# Patient Record
Sex: Male | Born: 1935 | Race: White | Hispanic: No | Marital: Married | Smoking: Former smoker
Health system: Southern US, Community
[De-identification: ages and names within clinical notes are randomized; demographics above are authoritative.]

## PROBLEM LIST (undated history)

## (undated) DIAGNOSIS — R5381 Other malaise: Secondary | ICD-10-CM

## (undated) DIAGNOSIS — K219 Gastro-esophageal reflux disease without esophagitis: Secondary | ICD-10-CM

## (undated) DIAGNOSIS — R062 Wheezing: Secondary | ICD-10-CM

## (undated) DIAGNOSIS — Z9889 Other specified postprocedural states: Secondary | ICD-10-CM

## (undated) DIAGNOSIS — Z87898 Personal history of other specified conditions: Secondary | ICD-10-CM

## (undated) DIAGNOSIS — J449 Chronic obstructive pulmonary disease, unspecified: Secondary | ICD-10-CM

## (undated) DIAGNOSIS — R911 Solitary pulmonary nodule: Secondary | ICD-10-CM

## (undated) DIAGNOSIS — E785 Hyperlipidemia, unspecified: Secondary | ICD-10-CM

## (undated) DIAGNOSIS — R5383 Other fatigue: Secondary | ICD-10-CM

## (undated) DIAGNOSIS — I10 Essential (primary) hypertension: Secondary | ICD-10-CM

## (undated) DIAGNOSIS — E739 Lactose intolerance, unspecified: Secondary | ICD-10-CM

## (undated) DIAGNOSIS — Z8669 Personal history of other diseases of the nervous system and sense organs: Secondary | ICD-10-CM

## (undated) DIAGNOSIS — Z9089 Acquired absence of other organs: Secondary | ICD-10-CM

## (undated) DIAGNOSIS — M545 Low back pain: Secondary | ICD-10-CM

## (undated) DIAGNOSIS — M479 Spondylosis, unspecified: Secondary | ICD-10-CM

## (undated) DIAGNOSIS — J8410 Pulmonary fibrosis, unspecified: Secondary | ICD-10-CM

## (undated) DIAGNOSIS — M129 Arthropathy, unspecified: Secondary | ICD-10-CM

## (undated) DIAGNOSIS — K5730 Diverticulosis of large intestine without perforation or abscess without bleeding: Secondary | ICD-10-CM

## (undated) DIAGNOSIS — Z8719 Personal history of other diseases of the digestive system: Secondary | ICD-10-CM

## (undated) DIAGNOSIS — I2510 Atherosclerotic heart disease of native coronary artery without angina pectoris: Secondary | ICD-10-CM

## (undated) DIAGNOSIS — R42 Dizziness and giddiness: Secondary | ICD-10-CM

## (undated) DIAGNOSIS — J309 Allergic rhinitis, unspecified: Secondary | ICD-10-CM

## (undated) DIAGNOSIS — I739 Peripheral vascular disease, unspecified: Secondary | ICD-10-CM

## (undated) HISTORY — DX: Other malaise: R53.81

## (undated) HISTORY — DX: Essential (primary) hypertension: I10

## (undated) HISTORY — DX: Dizziness and giddiness: R42

## (undated) HISTORY — DX: Atherosclerotic heart disease of native coronary artery without angina pectoris: I25.10

## (undated) HISTORY — DX: Diverticulosis of large intestine without perforation or abscess without bleeding: K57.30

## (undated) HISTORY — PX: CARPAL TUNNEL RELEASE: SHX101

## (undated) HISTORY — DX: Gastro-esophageal reflux disease without esophagitis: K21.9

## (undated) HISTORY — DX: Hyperlipidemia, unspecified: E78.5

## (undated) HISTORY — DX: Lactose intolerance, unspecified: E73.9

## (undated) HISTORY — DX: Chronic obstructive pulmonary disease, unspecified: J44.9

## (undated) HISTORY — DX: Allergic rhinitis, unspecified: J30.9

## (undated) HISTORY — PX: TONSILLECTOMY: SUR1361

## (undated) HISTORY — DX: Personal history of other specified conditions: Z87.898

## (undated) HISTORY — DX: Acquired absence of other organs: Z90.89

## (undated) HISTORY — DX: Other specified postprocedural states: Z98.89

## (undated) HISTORY — DX: Spondylosis, unspecified: M47.9

## (undated) HISTORY — DX: Peripheral vascular disease, unspecified: I73.9

## (undated) HISTORY — PX: HEMORRHOID SURGERY: SHX153

## (undated) HISTORY — PX: CATARACT EXTRACTION: SUR2

## (undated) HISTORY — PX: APPENDECTOMY: SHX54

## (undated) HISTORY — DX: Solitary pulmonary nodule: R91.1

## (undated) HISTORY — DX: Wheezing: R06.2

## (undated) HISTORY — DX: Arthropathy, unspecified: M12.9

## (undated) HISTORY — DX: Personal history of other diseases of the nervous system and sense organs: Z86.69

## (undated) HISTORY — DX: Other fatigue: R53.83

## (undated) HISTORY — DX: Pulmonary fibrosis, unspecified: J84.10

## (undated) HISTORY — DX: Low back pain: M54.5

## (undated) HISTORY — PX: OTHER SURGICAL HISTORY: SHX169

## (undated) HISTORY — DX: Personal history of other diseases of the digestive system: Z87.19

---

## 1999-07-05 ENCOUNTER — Ambulatory Visit (HOSPITAL_COMMUNITY): Admission: RE | Admit: 1999-07-05 | Discharge: 1999-07-05 | Payer: Self-pay | Admitting: *Deleted

## 1999-07-05 ENCOUNTER — Encounter: Payer: Self-pay | Admitting: *Deleted

## 2000-08-21 ENCOUNTER — Ambulatory Visit (HOSPITAL_COMMUNITY): Admission: RE | Admit: 2000-08-21 | Discharge: 2000-08-21 | Payer: Self-pay | Admitting: Internal Medicine

## 2000-08-21 ENCOUNTER — Encounter: Payer: Self-pay | Admitting: Internal Medicine

## 2000-12-02 ENCOUNTER — Encounter: Payer: Self-pay | Admitting: Internal Medicine

## 2000-12-02 ENCOUNTER — Ambulatory Visit (HOSPITAL_COMMUNITY): Admission: RE | Admit: 2000-12-02 | Discharge: 2000-12-02 | Payer: Self-pay | Admitting: Internal Medicine

## 2004-09-01 ENCOUNTER — Encounter: Admission: RE | Admit: 2004-09-01 | Discharge: 2004-09-01 | Payer: Self-pay | Admitting: Orthopaedic Surgery

## 2004-11-24 ENCOUNTER — Ambulatory Visit: Payer: Self-pay | Admitting: Internal Medicine

## 2004-12-01 ENCOUNTER — Ambulatory Visit: Payer: Self-pay

## 2004-12-22 ENCOUNTER — Ambulatory Visit: Payer: Self-pay | Admitting: Internal Medicine

## 2005-11-19 ENCOUNTER — Ambulatory Visit: Payer: Self-pay | Admitting: Internal Medicine

## 2006-10-07 ENCOUNTER — Ambulatory Visit: Payer: Self-pay | Admitting: Gastroenterology

## 2006-10-21 ENCOUNTER — Ambulatory Visit: Payer: Self-pay | Admitting: Gastroenterology

## 2006-12-03 ENCOUNTER — Ambulatory Visit: Payer: Self-pay

## 2006-12-04 ENCOUNTER — Ambulatory Visit: Payer: Self-pay | Admitting: Internal Medicine

## 2006-12-04 LAB — CONVERTED CEMR LAB
ALT: 30 units/L (ref 0–40)
AST: 29 units/L (ref 0–37)
Albumin: 4 g/dL (ref 3.5–5.2)
Alkaline Phosphatase: 69 units/L (ref 39–117)
BUN: 14 mg/dL (ref 6–23)
Basophils Absolute: 0 10*3/uL (ref 0.0–0.1)
Basophils Relative: 0.1 % (ref 0.0–1.0)
Bilirubin, Direct: 0.2 mg/dL (ref 0.0–0.3)
CO2: 29 meq/L (ref 19–32)
Calcium: 9.4 mg/dL (ref 8.4–10.5)
Chloride: 105 meq/L (ref 96–112)
Cholesterol: 133 mg/dL (ref 0–200)
Creatinine, Ser: 1.1 mg/dL (ref 0.4–1.5)
Eosinophils Absolute: 0.3 10*3/uL (ref 0.0–0.6)
Eosinophils Relative: 4.1 % (ref 0.0–5.0)
GFR calc Af Amer: 86 mL/min
GFR calc non Af Amer: 71 mL/min
Glucose, Bld: 117 mg/dL — ABNORMAL HIGH (ref 70–99)
HCT: 48.4 % (ref 39.0–52.0)
HDL: 46.5 mg/dL (ref >39.0)
Hemoglobin: 16.9 g/dL (ref 13.0–17.0)
LDL Cholesterol: 71 mg/dL (ref 0–99)
Lymphocytes Relative: 20.7 % (ref 12.0–46.0)
MCHC: 34.8 g/dL (ref 30.0–36.0)
MCV: 90.2 fL (ref 78.0–100.0)
Monocytes Absolute: 0.5 10*3/uL (ref 0.2–0.7)
Monocytes Relative: 7.9 % (ref 3.0–11.0)
Neutro Abs: 4.6 10*3/uL (ref 1.4–7.7)
Neutrophils Relative %: 67.2 % (ref 43.0–77.0)
Platelets: 207 10*3/uL (ref 150–400)
Potassium: 4.8 meq/L (ref 3.5–5.1)
RBC: 5.37 M/uL (ref 4.22–5.81)
RDW: 13 % (ref 11.5–14.6)
Sodium: 140 meq/L (ref 135–145)
TSH: 2.41 microintl units/mL (ref 0.35–5.50)
Total Bilirubin: 1 mg/dL (ref 0.3–1.2)
Total CHOL/HDL Ratio: 2.9
Total Protein: 7.9 g/dL (ref 6.0–8.3)
Triglycerides: 77 mg/dL (ref 0–149)
VLDL: 15 mg/dL (ref 0–40)
WBC: 6.8 10*3/uL (ref 4.5–10.5)

## 2007-07-25 ENCOUNTER — Encounter: Payer: Self-pay | Admitting: *Deleted

## 2007-07-25 DIAGNOSIS — Z87898 Personal history of other specified conditions: Secondary | ICD-10-CM

## 2007-07-25 DIAGNOSIS — Z9089 Acquired absence of other organs: Secondary | ICD-10-CM

## 2007-07-25 DIAGNOSIS — E785 Hyperlipidemia, unspecified: Secondary | ICD-10-CM | POA: Insufficient documentation

## 2007-07-25 DIAGNOSIS — Z8669 Personal history of other diseases of the nervous system and sense organs: Secondary | ICD-10-CM | POA: Insufficient documentation

## 2007-07-25 DIAGNOSIS — K219 Gastro-esophageal reflux disease without esophagitis: Secondary | ICD-10-CM

## 2007-07-25 DIAGNOSIS — I10 Essential (primary) hypertension: Secondary | ICD-10-CM

## 2007-07-25 DIAGNOSIS — M129 Arthropathy, unspecified: Secondary | ICD-10-CM

## 2007-07-25 DIAGNOSIS — M479 Spondylosis, unspecified: Secondary | ICD-10-CM | POA: Insufficient documentation

## 2007-07-25 DIAGNOSIS — Z8719 Personal history of other diseases of the digestive system: Secondary | ICD-10-CM

## 2007-07-25 DIAGNOSIS — Z9889 Other specified postprocedural states: Secondary | ICD-10-CM

## 2007-07-25 DIAGNOSIS — I2510 Atherosclerotic heart disease of native coronary artery without angina pectoris: Secondary | ICD-10-CM

## 2007-07-25 HISTORY — DX: Gastro-esophageal reflux disease without esophagitis: K21.9

## 2007-07-25 HISTORY — DX: Arthropathy, unspecified: M12.9

## 2007-07-25 HISTORY — DX: Personal history of other diseases of the digestive system: Z87.19

## 2007-07-25 HISTORY — DX: Essential (primary) hypertension: I10

## 2007-07-25 HISTORY — DX: Spondylosis, unspecified: M47.9

## 2007-07-25 HISTORY — DX: Personal history of other specified conditions: Z87.898

## 2007-07-25 HISTORY — DX: Hyperlipidemia, unspecified: E78.5

## 2007-07-25 HISTORY — DX: Atherosclerotic heart disease of native coronary artery without angina pectoris: I25.10

## 2007-07-25 HISTORY — DX: Other specified postprocedural states: Z98.89

## 2007-07-25 HISTORY — DX: Acquired absence of other organs: Z90.89

## 2007-07-25 HISTORY — DX: Personal history of other diseases of the nervous system and sense organs: Z86.69

## 2007-10-30 ENCOUNTER — Encounter: Payer: Self-pay | Admitting: Internal Medicine

## 2007-12-03 ENCOUNTER — Encounter: Payer: Self-pay | Admitting: Internal Medicine

## 2007-12-03 ENCOUNTER — Ambulatory Visit: Payer: Self-pay

## 2007-12-08 ENCOUNTER — Ambulatory Visit: Payer: Self-pay | Admitting: Internal Medicine

## 2007-12-08 DIAGNOSIS — E739 Lactose intolerance, unspecified: Secondary | ICD-10-CM

## 2007-12-08 DIAGNOSIS — J309 Allergic rhinitis, unspecified: Secondary | ICD-10-CM | POA: Insufficient documentation

## 2007-12-08 DIAGNOSIS — R5381 Other malaise: Secondary | ICD-10-CM | POA: Insufficient documentation

## 2007-12-08 DIAGNOSIS — K5730 Diverticulosis of large intestine without perforation or abscess without bleeding: Secondary | ICD-10-CM | POA: Insufficient documentation

## 2007-12-08 DIAGNOSIS — I739 Peripheral vascular disease, unspecified: Secondary | ICD-10-CM

## 2007-12-08 DIAGNOSIS — R5383 Other fatigue: Secondary | ICD-10-CM

## 2007-12-08 HISTORY — DX: Diverticulosis of large intestine without perforation or abscess without bleeding: K57.30

## 2007-12-08 HISTORY — DX: Lactose intolerance, unspecified: E73.9

## 2007-12-08 HISTORY — DX: Other malaise: R53.81

## 2007-12-08 HISTORY — DX: Peripheral vascular disease, unspecified: I73.9

## 2007-12-08 HISTORY — DX: Allergic rhinitis, unspecified: J30.9

## 2008-12-03 ENCOUNTER — Encounter: Payer: Self-pay | Admitting: Internal Medicine

## 2008-12-03 ENCOUNTER — Ambulatory Visit: Payer: Self-pay

## 2008-12-07 ENCOUNTER — Telehealth: Payer: Self-pay | Admitting: Internal Medicine

## 2008-12-08 ENCOUNTER — Ambulatory Visit: Payer: Self-pay | Admitting: Internal Medicine

## 2009-09-08 ENCOUNTER — Ambulatory Visit: Payer: Self-pay | Admitting: Internal Medicine

## 2009-09-08 DIAGNOSIS — R42 Dizziness and giddiness: Secondary | ICD-10-CM

## 2009-09-08 HISTORY — DX: Dizziness and giddiness: R42

## 2009-09-09 ENCOUNTER — Encounter: Payer: Self-pay | Admitting: Internal Medicine

## 2009-09-09 ENCOUNTER — Ambulatory Visit: Payer: Self-pay | Admitting: Internal Medicine

## 2009-09-09 DIAGNOSIS — J4489 Other specified chronic obstructive pulmonary disease: Secondary | ICD-10-CM

## 2009-09-09 DIAGNOSIS — J449 Chronic obstructive pulmonary disease, unspecified: Secondary | ICD-10-CM

## 2009-09-09 HISTORY — DX: Other specified chronic obstructive pulmonary disease: J44.89

## 2009-09-09 HISTORY — DX: Chronic obstructive pulmonary disease, unspecified: J44.9

## 2009-09-16 ENCOUNTER — Telehealth: Payer: Self-pay | Admitting: Internal Medicine

## 2009-09-16 ENCOUNTER — Telehealth (INDEPENDENT_AMBULATORY_CARE_PROVIDER_SITE_OTHER): Payer: Self-pay | Admitting: *Deleted

## 2009-09-26 ENCOUNTER — Ambulatory Visit: Payer: Self-pay | Admitting: Cardiovascular Disease

## 2009-09-29 ENCOUNTER — Telehealth: Payer: Self-pay | Admitting: Cardiovascular Disease

## 2009-10-07 ENCOUNTER — Ambulatory Visit: Payer: Self-pay | Admitting: Cardiovascular Disease

## 2009-12-09 ENCOUNTER — Ambulatory Visit: Payer: Self-pay | Admitting: Internal Medicine

## 2009-12-09 LAB — CONVERTED CEMR LAB
Alkaline Phosphatase: 62 units/L (ref 39–117)
Basophils Absolute: 0.2 10*3/uL — ABNORMAL HIGH (ref 0.0–0.1)
Bilirubin Urine: NEGATIVE
Bilirubin, Direct: 0.2 mg/dL (ref 0.0–0.3)
Chloride: 102 meq/L (ref 96–112)
Cholesterol: 121 mg/dL (ref 0–200)
Eosinophils Absolute: 0.4 10*3/uL (ref 0.0–0.7)
GFR calc non Af Amer: 78.21 mL/min (ref >60)
HDL: 49.6 mg/dL (ref >39.00)
LDL Cholesterol: 57 mg/dL (ref 0–99)
Leukocytes, UA: NEGATIVE
Lymphocytes Relative: 20.1 % (ref 12.0–46.0)
MCHC: 33.5 g/dL (ref 30.0–36.0)
MCV: 93.8 fL (ref 78.0–100.0)
Monocytes Absolute: 0.5 10*3/uL (ref 0.1–1.0)
Neutrophils Relative %: 61.4 % (ref 43.0–77.0)
Nitrite: NEGATIVE
PSA: 0.43 ng/mL (ref 0.10–4.00)
Platelets: 204 10*3/uL (ref 150.0–400.0)
Potassium: 4.7 meq/L (ref 3.5–5.1)
RDW: 12.5 % (ref 11.5–14.6)
Sodium: 138 meq/L (ref 135–145)
Specific Gravity, Urine: 1.01 (ref 1.000–1.030)
Total Bilirubin: 0.7 mg/dL (ref 0.3–1.2)
Total Protein: 8 g/dL (ref 6.0–8.3)
VLDL: 14.6 mg/dL (ref 0.0–40.0)
pH: 7.5 (ref 5.0–8.0)

## 2009-12-12 ENCOUNTER — Ambulatory Visit: Payer: Self-pay | Admitting: Internal Medicine

## 2010-03-21 ENCOUNTER — Telehealth: Payer: Self-pay | Admitting: Internal Medicine

## 2010-05-23 ENCOUNTER — Encounter: Payer: Self-pay | Admitting: Internal Medicine

## 2010-09-29 ENCOUNTER — Ambulatory Visit
Admission: RE | Admit: 2010-09-29 | Discharge: 2010-09-29 | Payer: Self-pay | Source: Home / Self Care | Attending: Internal Medicine | Admitting: Internal Medicine

## 2010-09-29 ENCOUNTER — Encounter: Payer: Self-pay | Admitting: Internal Medicine

## 2010-09-29 DIAGNOSIS — R911 Solitary pulmonary nodule: Secondary | ICD-10-CM

## 2010-09-29 DIAGNOSIS — R062 Wheezing: Secondary | ICD-10-CM

## 2010-09-29 HISTORY — DX: Solitary pulmonary nodule: R91.1

## 2010-09-29 HISTORY — DX: Wheezing: R06.2

## 2010-10-03 ENCOUNTER — Ambulatory Visit: Payer: Self-pay | Admitting: Cardiology

## 2010-10-03 ENCOUNTER — Ambulatory Visit
Admission: RE | Admit: 2010-10-03 | Discharge: 2010-10-03 | Payer: Self-pay | Source: Home / Self Care | Attending: Internal Medicine | Admitting: Internal Medicine

## 2010-10-03 DIAGNOSIS — R072 Precordial pain: Secondary | ICD-10-CM

## 2010-10-06 ENCOUNTER — Encounter: Payer: Self-pay | Admitting: Internal Medicine

## 2010-10-09 ENCOUNTER — Telehealth: Payer: Self-pay | Admitting: Internal Medicine

## 2010-10-29 LAB — CONVERTED CEMR LAB
AST: 25 units/L (ref 0–37)
Albumin: 3.8 g/dL (ref 3.5–5.2)
Albumin: 3.8 g/dL (ref 3.5–5.2)
Alkaline Phosphatase: 66 units/L (ref 39–117)
BUN: 11 mg/dL (ref 6–23)
BUN: 15 mg/dL (ref 6–23)
Basophils Absolute: 0 10*3/uL (ref 0.0–0.1)
Basophils Absolute: 0 10*3/uL (ref 0.0–0.1)
Basophils Absolute: 0 10*3/uL (ref 0.0–0.1)
Basophils Relative: 0.5 % (ref 0.0–3.0)
Bilirubin Urine: NEGATIVE
CO2: 30 meq/L (ref 19–32)
Calcium: 9.2 mg/dL (ref 8.4–10.5)
Calcium: 9.6 mg/dL (ref 8.4–10.5)
Chloride: 104 meq/L (ref 96–112)
Chloride: 105 meq/L (ref 96–112)
Cholesterol: 132 mg/dL (ref 0–200)
Creatinine, Ser: 1.1 mg/dL (ref 0.4–1.5)
Creatinine, Ser: 1.1 mg/dL (ref 0.4–1.5)
Creatinine, Ser: 1.2 mg/dL (ref 0.4–1.5)
Eosinophils Absolute: 0.2 10*3/uL (ref 0.0–0.7)
Eosinophils Absolute: 0.3 10*3/uL (ref 0.0–0.7)
Folate: 12.3 ng/mL
GFR calc Af Amer: 85 mL/min
GFR calc non Af Amer: 70 mL/min
GFR calc non Af Amer: 70.12 mL/min (ref >60)
HCT: 49.8 % (ref 39.0–52.0)
HDL: 46 mg/dL (ref >39.0)
HDL: 46.2 mg/dL (ref >39.0)
Hemoglobin, Urine: NEGATIVE
Hemoglobin: 16 g/dL (ref 13.0–17.0)
Hgb A1c MFr Bld: 5.9 % (ref 4.6–6.0)
Ketones, ur: NEGATIVE mg/dL
LDL Cholesterol: 65 mg/dL (ref 0–99)
Lymphocytes Relative: 18.7 % (ref 12.0–46.0)
MCHC: 33 g/dL (ref 30.0–36.0)
MCHC: 33.6 g/dL (ref 30.0–36.0)
MCHC: 33.7 g/dL (ref 30.0–36.0)
Monocytes Absolute: 0.7 10*3/uL (ref 0.1–1.0)
Monocytes Relative: 10 % (ref 3.0–11.0)
Monocytes Relative: 11.1 % (ref 3.0–12.0)
Monocytes Relative: 8.8 % (ref 3.0–12.0)
Neutro Abs: 3.8 10*3/uL (ref 1.4–7.7)
Neutro Abs: 4.3 10*3/uL (ref 1.4–7.7)
Neutrophils Relative %: 68.6 % (ref 43.0–77.0)
Nitrite: NEGATIVE
Platelets: 198 10*3/uL (ref 150–400)
Platelets: 221 10*3/uL (ref 150–400)
RBC: 4.96 M/uL (ref 4.22–5.81)
RBC: 5.36 M/uL (ref 4.22–5.81)
RDW: 12.4 % (ref 11.5–14.6)
RDW: 12.7 % (ref 11.5–14.6)
RDW: 12.7 % (ref 11.5–14.6)
Sed Rate: 8 mm/hr (ref 0–16)
Sodium: 140 meq/L (ref 135–145)
TSH: 2.42 microintl units/mL (ref 0.35–5.50)
TSH: 2.9 microintl units/mL (ref 0.35–5.50)
Total Bilirubin: 0.7 mg/dL (ref 0.3–1.2)
Total Bilirubin: 0.8 mg/dL (ref 0.3–1.2)
Total CHOL/HDL Ratio: 2.9
Total CHOL/HDL Ratio: 3.1
Total Protein, Urine: NEGATIVE mg/dL
Triglycerides: 58 mg/dL (ref 0–149)
VLDL: 12 mg/dL (ref 0–40)
Vitamin B-12: 238 pg/mL (ref 211–911)

## 2010-10-31 NOTE — Assessment & Plan Note (Signed)
Summary: YEARLY PHYSICAL-LB   Vital Signs:  Patient profile:   75 year old male Height:      71 inches Weight:      183.56 pounds BMI:     25.69 O2 Sat:      94 % on Room air Temp:     98.2 degrees F oral Pulse rate:   56 / minute BP sitting:   152 / 80  (left arm) Cuff size:   regular  Vitals Entered ByZella Ball Ewing (December 12, 2009 8:31 AM)  O2 Flow:  Room air  Preventive Care Screening  Last Flu Shot:    Date:  08/31/2009    Results:  given   CC: Adult Physical/RE   CC:  Adult Physical/RE.  History of Present Illness: dizziness resolved;  BP at home < 140/90 but occasionally up to 160 with stress it seems;  Pt denies CP, sob, doe, wheezing, orthopnea, pnd, worsening LE edema, palps, dizziness or syncope  Pt denies new neuro symptoms such as headache, facial or extremity weakness Denies signfiicant polydipsia or polyruia  Here for wellness Diet: Heart Healthy or DM if diabetic Physical Activities: Sedentary Depression/mood screen: Negative Hearing: Intact bilateral Visual Acuity: Grossly normal ADL's: Capable  Fall Risk: None Home Safety: Good End-of-Life Planning: Advance directive - Full code/I agree   Preventive Screening-Counseling & Management      Drug Use:  no.    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 1 By Mouth Once Daily 2)  Dovonex 0.005 %  Crea (Calcipotriene) .... Use Daily 3)  Glucosamine  Tabs .... Take One Tablet Once Daily 4)  Altace 10 Mg  Tabs (Ramipril) .... Take One Tablet Twice Daily (Daw) 5)  Viagra 50 Mg  Tabs (Sildenafil Citrate) .... Take As Needed Once Daily By Mouth 6)  Nasacort Aq 55 Mcg/act  Aers (Triamcinolone Acetonide(Nasal)) .... 2 Spary/side Once Daily 7)  Nexium 40 Mg  Cpdr (Esomeprazole Magnesium) .Marland Kitchen.. 1 By Mouth Once Daily 8)  Ecotrin Low Strength 81 Mg  Tbec (Aspirin) .Marland Kitchen.. 1po Qd 9)  Meclizine Hcl 12.5 Mg Tabs (Meclizine Hcl) .Marland Kitchen.. 1 -2 By Mouth Q 6 Hrs As Needed Dizzy  Current Medications (verified): 1)  Simvastatin 40 Mg  Tabs (Simvastatin) .Marland Kitchen.. 1 By Mouth Once Daily 2)  Dovonex 0.005 %  Crea (Calcipotriene) .... Use Daily 3)  Glucosamine  Tabs .... Take One Tablet Once Daily 4)  Altace 10 Mg  Tabs (Ramipril) .... Take One Tablet Twice Daily (Daw) 5)  Viagra 50 Mg  Tabs (Sildenafil Citrate) .... Take As Needed  Once Daily By Mouth 6)  Nasacort Aq 55 Mcg/act  Aers (Triamcinolone Acetonide(Nasal)) .... 2 Spray/side Once Daily 7)  Nexium 40 Mg  Cpdr (Esomeprazole Magnesium) .Marland Kitchen.. 1 By Mouth Once Daily 8)  Ecotrin Low Strength 81 Mg  Tbec (Aspirin) .Marland Kitchen.. 1po Qd 9)  Meclizine Hcl 12.5 Mg Tabs (Meclizine Hcl) .Marland Kitchen.. 1 -2 By Mouth Q 6 Hrs As Needed Dizzy  Allergies (verified): 1)  ! Imdur 2)  ! Codeine  Past History:  Past Medical History: Last updated: 12/08/2007 DIVERTICULOSIS, COLON, HX OF (ICD-V12.79) DEGENERATIVE JOINT DISEASE, CERVICAL SPINE (ICD-721.90) CORONARY ARTERY DISEASE (ICD-414.00) - non-obstructive 3/05 GERD (ICD-530.81) CARPAL TUNNEL RELEASE, HX OF  (ICD-V45.89) BENIGN PROSTATIC HYPERTROPHY, HX OF (ICD-V13.8) ARTHRITIS (ICD-716.90) HYPERLIPIDEMIA (ICD-272.4) HYPERTENSION (ICD-401.9) Diverticulosis, colon Allergic rhinitis Peripheral vascular disease - carotid glucose intolerance T-spine and c-spine DJD E.D. 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         all otherwise negative per pt -    Physical Exam  General:  alert and overweight-appearing.   Head:  normocephalic and atraumatic.   Eyes:  vision grossly intact, pupils equal, and pupils round.   Ears:  R ear normal and L ear normal.   Nose:  no external deformity and no nasal discharge.   Mouth:  no gingival abnormalities and pharynx  pink and moist.   Neck:  supple and no masses.   Lungs:  normal respiratory effort, R decreased breath sounds, and L decreased breath sounds.   Heart:  normal rate and regular rhythm.   Abdomen:  soft, non-tender, and normal bowel sounds.   Msk:  no joint tenderness and no joint swelling.   Extremities:  no edema, no erythema  Neurologic:  cranial nerves II-XII intact, strength normal in all extremities, and DTRs symmetrical and normal.     Impression & Recommendations:  Problem # 1:  Preventive Health Care (ICD-V70.0)  Overall doing well, age appropriate education and counseling updated and referral for appropriate preventive services done unless declined, immunizations up to date or declined, diet counseling done if overweight, urged to quit smoking if smokes , most recent labs reviewed and current ordered if appropriate, ecg reviewed or declined (interpretation per ECG scanned in the EMR if done); information regarding Medicare Prevention requirements given if appropriate   Orders: First annual wellness visit with prevention plan  (V4098)  Problem # 2:  COPD (ICD-496) stable overall by hx and exam, ok to continue meds/tx as is, -  no meds needed at this time  Problem # 3:  DIZZINESS (ICD-780.4)  His updated medication list for this problem includes:    Meclizine Hcl 12.5 Mg Tabs (Meclizine hcl) .Marland Kitchen... 1 -2 by mouth q 6 hrs as needed dizzy resolved, ok to follow  Problem # 4:  GLUCOSE INTOLERANCE (ICD-271.3) asympt - ok to follow  Problem # 5:  HYPERTENSION (ICD-401.9)  His updated medication list for this problem includes:    Altace 10 Mg Tabs (Ramipril) .Marland Kitchen... Take one tablet twice daily (daw)  BP today: 152/80 Prior BP: 150/80 (09/08/2009)  Labs Reviewed: K+: 4.7 (12/09/2009) Creat: : 1.0 (12/09/2009)   Chol: 121 (12/09/2009)   HDL: 49.60 (12/09/2009)   LDL: 57 (12/09/2009)   TG: 73.0 (12/09/2009) stable overall by hx and exam, ok to continue meds/tx as is , ecg  reviewed  Orders: Prescription Created Electronically [phone]) EKG w/ Interpretation (93000)  Problem # 6:  HYPERLIPIDEMIA (ICD-272.4)  His updated medication list for this problem includes:    Simvastatin 40 Mg Tabs (Simvastatin) .Marland Kitchen... 1 by mouth once daily  Labs Reviewed: SGOT: 29 (12/09/2009)   SGPT: 31 (12/09/2009)   HDL:49.60 (12/09/2009), 46.2 (12/08/2008)  LDL:57 (12/09/2009), 83 (12/08/2008)  Chol:121 (12/09/2009), 141 (12/08/2008)  Trig:73.0 (12/09/2009), 58 (12/08/2008) stable overall by hx and exam, ok to continue meds/tx as is   Complete Medication List: 1)  Simvastatin 40 Mg Tabs (Simvastatin) .Marland Kitchen.. 1 by mouth once daily 2)  Dovonex 0.005 % Crea (Calcipotriene) .... Use daily 3)  Glucosamine Tabs  .... Take one tablet once daily 4)  Altace 10 Mg Tabs (Ramipril) .... Take one tablet twice daily (daw) 5)  Viagra 50 Mg Tabs (Sildenafil citrate) .... Take as needed once daily by mouth 6)  Nasacort Aq 55 Mcg/act Aers (Triamcinolone acetonide(nasal)) .... 2 spray/side once daily 7)  Nexium 40 Mg Cpdr (Esomeprazole magnesium) .Marland Kitchen.. 1 by mouth once daily 8)  Ecotrin Low Strength 81 Mg Tbec (Aspirin) .Marland Kitchen.. 1po qd 9)  Meclizine Hcl 12.5 Mg Tabs (Meclizine hcl) .Marland Kitchen.. 1 -2 by mouth q 6 hrs as needed dizzy  Patient Instructions: 1)  Your blood work was excellent today 2)  Continue all previous medications as before this visit  3)  Your are given the refills today 4)  Please schedule a follow-up appointment in 6 months. 5)  Check your Blood Pressure regularly. If it is above 140/90 on a regular basis: you should make an appointment sooner Prescriptions: VIAGRA 50 MG  TABS (SILDENAFIL CITRATE) take as needed once daily by mouth  #5 x 11   Entered and Authorized by:   Corwin Levins MD   Signed by:   Corwin Levins MD on 12/12/2009   Method used:   Print then Give to Patient   RxID:   [id number] NEXIUM 40 MG  CPDR (ESOMEPRAZOLE MAGNESIUM) 1 by mouth once daily  #90 x 3   Entered and  Authorized by:   Corwin Levins MD   Signed by:   Corwin Levins MD on 12/12/2009   Method used:   Print then Give to Patient   RxID:   [id number] SIMVASTATIN 40 MG  TABS (SIMVASTATIN) 1 by mouth once daily  #90 x 3   Entered and Authorized by:   Corwin Levins MD   Signed by:   Corwin Levins MD on 12/12/2009   Method used:   Print then Give to Patient   RxID:   [id number] ALTACE 10 MG  TABS (RAMIPRIL) Take one tablet twice daily (DAW)  #  180 x 3   Entered and Authorized by:   Corwin Levins MD   Signed by:   Corwin Levins MD on 12/12/2009   Method used:   Print then Give to Patient   RxID:   [id number] NASACORT AQ 55 MCG/ACT  AERS (TRIAMCINOLONE ACETONIDE(NASAL)) 2 spray/side once daily  #3 x 3   Entered and Authorized by:   Corwin Levins MD   Signed by:   Corwin Levins MD on 12/12/2009   Method used:   Print then Give to Patient   RxID:   [id number]

## 2010-10-31 NOTE — Progress Notes (Signed)
Summary: Viagra PA  Phone Note From Pharmacy   [facility] Summary of Call: PA request--Viagra. Levitra is the preferred med, please advise. Initial call taken by: Lucious Groves,  March 21, 2010 2:18 PM  Follow-up for Phone Call        call pt - ok for levitra instead?   Follow-up by: Corwin Levins MD,  March 21, 2010 2:19 PM  Additional Follow-up for Phone Call Additional follow up Details #1::        pt is okay to try Levitra. [facility] in [place] Additional Follow-up by: Margaret Pyle, CMA,  March 21, 2010 3:20 PM    Additional Follow-up for Phone Call Additional follow up Details #2::    done escript Follow-up by: Corwin Levins MD,  March 21, 2010 3:43 PM  New/Updated Medications: LEVITRA 20 MG TABS (VARDENAFIL HCL) 1po once daily as needed Prescriptions: LEVITRA 20 MG TABS (VARDENAFIL HCL) 1po once daily as needed  #5 x 11   Entered and Authorized by:   Corwin Levins MD   Signed by:   Corwin Levins MD on 03/21/2010   Method used:   Electronically to        [facility] [address] (retail)       [address]       [place], [place]  [address]       Ph: [phone] or [phone]       Fax: [phone]   RxID:   [id number]

## 2010-10-31 NOTE — Procedures (Signed)
Summary: summary report  summary report   Imported By: Mirna Mires 11/17/2009 14:17:51  _____________________________________________________________________  External Attachment:    Type:   Image     Comment:   External Document

## 2010-10-31 NOTE — Procedures (Signed)
Summary: SUMMARY REPORT  SUMMARY REPORT   Imported By: Mirna Mires 10/14/2009 11:23:10  _____________________________________________________________________  External Attachment:    Type:   Image     Comment:   External Document

## 2010-10-31 NOTE — Medication Information (Signed)
Summary: Ramipril/[facility]  Ramipril/[facility]   Imported By: Sherian Rein 05/29/2010 08:19:56  _____________________________________________________________________  External Attachment:    Type:   Image     Comment:   External Document

## 2010-10-31 NOTE — Miscellaneous (Signed)
Summary: [facility]  [facility]   Imported By: Lester Texico 12/14/2009 09:12:16  _____________________________________________________________________  External Attachment:    Type:   Image     Comment:   External Document

## 2010-11-02 NOTE — Assessment & Plan Note (Signed)
Summary: COUGH  STC   Vital Signs:  Patient profile:   75 year old male Height:      71 inches Weight:      183 pounds BMI:     25.62 O2 Sat:      95 % on Room air Temp:     98.1 degrees F oral Pulse rate:   79 / minute BP sitting:   110 / 78  (left arm) Cuff size:   regular  Vitals Entered By: Zella Ball Ewing CMA Duncan Dull) (September 29, 2010 11:45 AM)  O2 Flow:  Room air CC: Cough and SOB/RE   CC:  Cough and SOB/RE.  History of Present Illness: here with acute onset 3 days mild to mod, gradually worsening fever, ST, mild prod cough - greenish sputum, and today mild wheezing , sob and doe  - overall  mild worse on chronic sob/doe pt states for the past 6 months;  Pt denies CP,  orthopnea, pnd, worsening LE edema, palps, dizziness or syncope .  Pt denies new neuro symptoms such as headache, facial or extremity weakness  Pt denies polydipsia, polyuria   Overall good compliance with meds, trying to follow low chol, diet, wt stable, little excercise however , and no hx of DM.  Overall good compliance with meds, and good tolerability.  Denies worsening depressive symptoms, suicidal ideation, or panic.  , but seems anxious with illness today.  No hx of afib or arrythemia, but does have hx of CAD/PVD, and COPD.  Does have baseline incresed dyspnea in the past 6 mo as well - requests inhaler.   Problems Prior to Update: 1)  Solitary Pulmonary Nodule  (ICD-793.11) 2)  Wheezing  (ICD-786.07) 3)  Bronchitis-acute  (ICD-466.0) 4)  Preventive Health Care  (ICD-V70.0) 5)  COPD  (ICD-496) 6)  Dizziness  (ICD-780.4) 7)  Glucose Intolerance  (ICD-271.3) 8)  Peripheral Vascular Disease  (ICD-443.9) 9)  Allergic Rhinitis  (ICD-477.9) 10)  Diverticulosis, Colon  (ICD-562.10) 11)  Glucose Intolerance  (ICD-271.3) 12)  Fatigue  (ICD-780.79) 13)  Rotator Cuff Repair, Right, Hx of  (ICD-V45.89) 14)  Diverticulosis, Colon, Hx of  (ICD-V12.79) 15)  Degenerative Joint Disease, Cervical Spine   (ICD-721.90) 16)  Cataracts, Bilateral, Hx of  (ICD-V12.40) 17)  Coronary Artery Disease  (ICD-414.00) 18)  Gerd  (ICD-530.81) 19)  Carpal Tunnel Release, Hx of  (ICD-V45.89) 20)  Hemorrhoidectomy, Hx of  (ICD-V45.89) 21)  Appendectomy, Hx of  (ICD-V45.79) 22)  Tonsillectomy, Hx of  (ICD-V45.79) 23)  Benign Prostatic Hypertrophy, Hx of  (ICD-V13.8) 24)  Arthritis  (ICD-716.90) 25)  Hyperlipidemia  (ICD-272.4) 26)  Hypertension  (ICD-401.9)  Medications Prior to Update: 1)  Simvastatin 40 Mg  Tabs (Simvastatin) .Marland Kitchen.. 1 By Mouth Once Daily 2)  Dovonex 0.005 %  Crea (Calcipotriene) .... Use Daily 3)  Glucosamine  Tabs .... Take One Tablet Once Daily 4)  Altace 10 Mg  Tabs (Ramipril) .... Take One Tablet Twice Daily (Daw) 5)  Levitra 20 Mg Tabs (Vardenafil Hcl) .Marland Kitchen.. 1po Once Daily As Needed 6)  Nasacort Aq 55 Mcg/act  Aers (Triamcinolone Acetonide(Nasal)) .... 2 Spray/side Once Daily 7)  Nexium 40 Mg  Cpdr (Esomeprazole Magnesium) .Marland Kitchen.. 1 By Mouth Once Daily 8)  Ecotrin Low Strength 81 Mg  Tbec (Aspirin) .Marland Kitchen.. 1po Qd 9)  Meclizine Hcl 12.5 Mg Tabs (Meclizine Hcl) .Marland Kitchen.. 1 -2 By Mouth Q 6 Hrs As Needed Dizzy  Current Medications (verified): 1)  Simvastatin 40 Mg  Tabs (Simvastatin) .Marland Kitchen.. 1 By  Mouth Once Daily 2)  Dovonex 0.005 %  Crea (Calcipotriene) .... Use Daily 3)  Glucosamine  Tabs .... Take One Tablet Once Daily 4)  Altace 10 Mg  Tabs (Ramipril) .... Take One Tablet Twice Daily (Daw) 5)  Levitra 20 Mg Tabs (Vardenafil Hcl) .Marland Kitchen.. 1po Once Daily As Needed 6)  Nasacort Aq 55 Mcg/act  Aers (Triamcinolone Acetonide(Nasal)) .... 2 Spray/side Once Daily 7)  Nexium 40 Mg  Cpdr (Esomeprazole Magnesium) .Marland Kitchen.. 1 By Mouth Once Daily 8)  Ecotrin Low Strength 81 Mg  Tbec (Aspirin) .Marland Kitchen.. 1po Qd 9)  Meclizine Hcl 12.5 Mg Tabs (Meclizine Hcl) .Marland Kitchen.. 1 -2 By Mouth Q 6 Hrs As Needed Dizzy 10)  Levofloxacin 500 Mg Tabs (Levofloxacin) .Marland Kitchen.. 1po Once Daily 11)  Prednisone 10 Mg Tabs (Prednisone) .... 3po Qd For  3days, Then 2po Qd For 3days, Then 1po Qd For 3days, Then Stop 12)  Tessalon Perles 100 Mg Caps (Benzonatate) .Marland Kitchen.. 1-2 By Mouth Three Times A Day As Needed Cough 13)  Symbicort 160-4.5 Mcg/act Aero (Budesonide-Formoterol Fumarate) .... 2 Puffs Two Times A Day  Allergies (verified): 1)  ! Imdur 2)  ! Codeine  Past History:  Past Medical History: Last updated: 12/08/2007 DIVERTICULOSIS, COLON, HX OF (ICD-V12.79) DEGENERATIVE JOINT DISEASE, CERVICAL SPINE (ICD-721.90) CORONARY ARTERY DISEASE (ICD-414.00) - non-obstructive 3/05 GERD (ICD-530.81) CARPAL TUNNEL RELEASE, HX OF (ICD-V45.89) BENIGN PROSTATIC HYPERTROPHY, HX OF (ICD-V13.8) ARTHRITIS (ICD-716.90) HYPERLIPIDEMIA (ICD-272.4) HYPERTENSION (ICD-401.9) Diverticulosis, colon Allergic rhinitis Peripheral vascular disease - carotid glucose intolerance T-spine and c-spine DJD E.D. 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 , mild ill  Head:  normocephalic and atraumatic.   Eyes:  vision grossly intact, pupils equal, and pupils round.   Ears:  bilat tm's red, sinus nontender bilat Nose:  nasal dischargemucosal pallor and mucosal edema.   Mouth:  pharyngeal erythema and fair dentition.   Neck:  supple and no masses.   Lungs:  normal respiratory effort, R decreased breath sounds, R wheezes, L decreased breath sounds, and L wheezes.   Heart:  normal rate and regular rhythm. with  occaisional ectopy noted Extremities:  no edema, no erythema    Impression & Recommendations:  Problem # 1:  BRONCHITIS-ACUTE (ICD-466.0)  His updated medication list for this problem includes:    Levofloxacin 500 Mg Tabs (Levofloxacin) .Marland Kitchen... 1po once daily    Tessalon Perles 100 Mg Caps (Benzonatate) .Marland Kitchen... 1-2 by mouth three times a day as needed cough    Symbicort 160-4.5 Mcg/act Aero (Budesonide-formoterol fumarate) .Marland Kitchen... 2 puffs two times a day treat as above, f/u any worsening signs or symptoms , cant r/o pna - for CXR as well, and mucinex/delsym otc as needed   Orders: T-2 View CXR, Same Day (71020.5TC) Admin of Therapeutic Inj  intramuscular or subcutaneous (16109) Depo- Medrol 40mg  (J1030)  Problem # 2:  WHEEZING (ICD-786.07) mild, likely due to above, for predpack for home, and depomedrol IM today Orders: T-2 View CXR, Same Day (71020.5TC)  Problem # 3:  HYPERTENSION (  ICD-401.9)  His updated medication list for this problem includes:    Altace 10 Mg Tabs (Ramipril) .Marland Kitchen... Take one tablet twice daily (daw) with some "irreg" noted on ausculataition, ECG done but is sinus rhythm, NAD on review - no new arythmia  Problem # 4:  COPD (ICD-496)  for more chronic symtpoms will check PFT' s, as well as trial symbicort 160  Orders: Misc. Referral (Misc. Ref)  His updated medication list for this problem includes:    Symbicort 160-4.5 Mcg/act Aero (Budesonide-formoterol fumarate) .Marland Kitchen... 2 puffs two times a day  Complete Medication List: 1)  Simvastatin 40 Mg Tabs (Simvastatin) .Marland Kitchen.. 1 by mouth once daily 2)  Dovonex 0.005 % Crea (Calcipotriene) .... Use daily 3)  Glucosamine Tabs  .... Take one tablet once daily 4)  Altace 10 Mg Tabs (Ramipril) .... Take one tablet twice daily (daw) 5)  Levitra 20 Mg Tabs (Vardenafil hcl) .Marland Kitchen.. 1po once daily as needed 6)  Nasacort Aq 55 Mcg/act Aers (Triamcinolone acetonide(nasal)) .... 2 spray/side once daily 7)  Nexium 40 Mg Cpdr  (Esomeprazole magnesium) .Marland Kitchen.. 1 by mouth once daily 8)  Ecotrin Low Strength 81 Mg Tbec (Aspirin) .Marland Kitchen.. 1po qd 9)  Meclizine Hcl 12.5 Mg Tabs (Meclizine hcl) .Marland Kitchen.. 1 -2 by mouth q 6 hrs as needed dizzy 10)  Levofloxacin 500 Mg Tabs (Levofloxacin) .Marland Kitchen.. 1po once daily 11)  Prednisone 10 Mg Tabs (Prednisone) .... 3po qd for 3days, then 2po qd for 3days, then 1po qd for 3days, then stop 12)  Tessalon Perles 100 Mg Caps (Benzonatate) .Marland Kitchen.. 1-2 by mouth three times a day as needed cough 13)  Symbicort 160-4.5 Mcg/act Aero (Budesonide-formoterol fumarate) .... 2 puffs two times a day  Patient Instructions: 1)  you had the steroid shot today 2)  Please take all new medications as prescribed - all were sent to the pharmacy  ([facility] [address]), except for the sample of the symbicort 3)  Continue all previous medications as before this visit 4)  You can also use Mucinex OTC or it's generic for congestion , as well as Delsym OTC for cough (if needed in addition to the pill prescription for cough) 5)  Please go to Radiology in the basement level for your X-Ray today 6)  You will be contacted about the referral(s) to: Lung testing   7)  Please call the number on the [facility] Card for results of your testing  8)  Please schedule a follow-up appointment in March 2012 for "yearly followup" Prescriptions: SYMBICORT 160-4.5 MCG/ACT AERO (BUDESONIDE-FORMOTEROL FUMARATE) 2 puffs two times a day  #1 x 11   Entered and Authorized by:   Corwin Levins MD   Signed by:   Corwin Levins MD on 09/29/2010   Method used:   Print then Give to Patient   RxID:   [id number] TESSALON PERLES 100 MG CAPS (BENZONATATE) 1-2 by mouth three times a day as needed cough  #60 x 1   Entered and Authorized by:   Corwin Levins MD   Signed by:   Corwin Levins MD on 09/29/2010   Method used:   Electronically to        [facility] [address] (retail)       [address]       [place], [place]  [address]       Ph:  [phone] or [phone]       Fax: [phone]  RxID:   [id number] PREDNISONE 10 MG TABS (PREDNISONE) 3po qd for 3days, then 2po qd for 3days, then 1po qd for 3days, then stop  #18 x 0   Entered and Authorized by:   Corwin Levins MD   Signed by:   Corwin Levins MD on 09/29/2010   Method used:   Electronically to        [facility] [address] (retail)       [address]       [place], [place]  [address]       Ph: [phone] or [phone]       Fax: [phone]   RxID:   [id number] LEVOFLOXACIN 500 MG TABS (LEVOFLOXACIN) 1po once daily  #10 x 0   Entered and Authorized by:   Corwin Levins MD   Signed by:   Corwin Levins MD on 09/29/2010   Method used:   Electronically to        [facility] [address] (retail)       [address]       [place], [place]  [address]       Ph: [phone] or [phone]       Fax: [phone]   RxID:   [id number]    Medication Administration  Injection # 1:    Medication: Depo- Medrol 40mg     Diagnosis: BRONCHITIS-ACUTE (ICD-466.0)    Route: IM    Site: R deltoid    Exp Date: 03/31/2013    Lot #: [id number]    Mfr: Pharmacia    Comments: 120mg     Patient tolerated injection without complications    Given by: Lamar Sprinkles, CMA (September 29, 2010 5:51 PM)  Orders Added: 1)  Misc. Referral [Misc. Ref] 2)  T-2 View CXR, Same Day [71020.5TC] 3)  Admin of Therapeutic Inj  intramuscular or subcutaneous [96372] 4)  Depo- Medrol 40mg  [J1030] 5)  Est. Patient Level IV [35009]     Medication Administration  Injection # 1:    Medication: Depo- Medrol 40mg     Diagnosis: BRONCHITIS-ACUTE (ICD-466.0)    Route: IM    Site: R deltoid    Exp Date: 03/31/2013    Lot #: [id number]    Mfr: Pharmacia    Comments: 120mg     Patient tolerated injection without complications    Given by: Lamar Sprinkles, CMA (September 29, 2010 5:51 PM)  Orders Added: 1)  Misc. Referral [Misc. Ref] 2)  T-2 View CXR,  Same Day [71020.5TC] 3)  Admin of Therapeutic Inj  intramuscular or subcutaneous [96372] 4)  Depo- Medrol 40mg  [J1030] 5)  Est. Patient Level IV [38182]

## 2010-11-02 NOTE — Miscellaneous (Signed)
Summary: Orders Update pft charges  Clinical Lists Changes  Orders: Added new Service order of Carbon Monoxide diffusing w/capacity (94720) - Signed Added new Service order of Lung Volumes (94240) - Signed Added new Service order of Spirometry (Pre & Post) (94060) - Signed 

## 2010-11-02 NOTE — Progress Notes (Signed)
  Phone Note Call from Patient Call back at Home Phone [phone]   Caller: Patient Call For: Corwin Levins MD Summary of Call: Pt requests results of recent pulmonary test. Pt states results not on phone tree. Initial call taken by: Verdell Face,  October 09, 2010 11:13 AM  Follow-up for Phone Call        Pt's spouse advised and will have pt  call back if any further questions or concerns Follow-up by: Margaret Pyle, CMA,  October 09, 2010 2:40 PM

## 2010-11-02 NOTE — Miscellaneous (Signed)
Summary: Orders Update  Clinical Lists Changes  Problems: Added new problem of SOLITARY PULMONARY NODULE (ICD-793.11) Orders: Added new Referral order of Radiology Referral (Radiology) - Signed

## 2010-12-14 ENCOUNTER — Encounter (INDEPENDENT_AMBULATORY_CARE_PROVIDER_SITE_OTHER): Payer: Medicare Other | Admitting: Internal Medicine

## 2010-12-14 ENCOUNTER — Encounter: Payer: Self-pay | Admitting: Internal Medicine

## 2010-12-14 ENCOUNTER — Other Ambulatory Visit: Payer: Self-pay | Admitting: Internal Medicine

## 2010-12-14 ENCOUNTER — Other Ambulatory Visit: Payer: Medicare Other

## 2010-12-14 DIAGNOSIS — R5383 Other fatigue: Secondary | ICD-10-CM

## 2010-12-14 DIAGNOSIS — R5381 Other malaise: Secondary | ICD-10-CM

## 2010-12-14 DIAGNOSIS — I10 Essential (primary) hypertension: Secondary | ICD-10-CM

## 2010-12-14 DIAGNOSIS — E785 Hyperlipidemia, unspecified: Secondary | ICD-10-CM

## 2010-12-14 DIAGNOSIS — E739 Lactose intolerance, unspecified: Secondary | ICD-10-CM

## 2010-12-14 DIAGNOSIS — J449 Chronic obstructive pulmonary disease, unspecified: Secondary | ICD-10-CM

## 2010-12-14 LAB — CBC WITH DIFFERENTIAL/PLATELET
Basophils Absolute: 0 10*3/uL (ref 0.0–0.1)
Lymphocytes Relative: 18.3 % (ref 12.0–46.0)
Monocytes Relative: 11.7 % (ref 3.0–12.0)
Platelets: 277 10*3/uL (ref 150.0–400.0)
RDW: 15.1 % — ABNORMAL HIGH (ref 11.5–14.6)
WBC: 6.9 10*3/uL (ref 4.5–10.5)

## 2010-12-14 LAB — BASIC METABOLIC PANEL
BUN: 15 mg/dL (ref 6–23)
Calcium: 9.3 mg/dL (ref 8.4–10.5)
GFR: 83.76 mL/min (ref >60.00)
Glucose, Bld: 100 mg/dL — ABNORMAL HIGH (ref 70–99)
Sodium: 137 mEq/L (ref 135–145)

## 2010-12-14 LAB — HEPATIC FUNCTION PANEL
AST: 21 U/L (ref 0–37)
Alkaline Phosphatase: 51 U/L (ref 39–117)
Bilirubin, Direct: 0.2 mg/dL (ref 0.0–0.3)
Total Bilirubin: 0.7 mg/dL (ref 0.3–1.2)

## 2010-12-14 LAB — LIPID PANEL
Cholesterol: 132 mg/dL (ref 0–200)
HDL: 49.3 mg/dL
LDL Cholesterol: 70 mg/dL (ref 0–99)
Total CHOL/HDL Ratio: 3
Triglycerides: 64 mg/dL (ref 0.0–149.0)
VLDL: 12.8 mg/dL (ref 0.0–40.0)

## 2010-12-14 LAB — URINALYSIS
Bilirubin Urine: NEGATIVE
Hgb urine dipstick: NEGATIVE
Ketones, ur: NEGATIVE
Leukocytes, UA: NEGATIVE
Nitrite: NEGATIVE
Specific Gravity, Urine: 1.01
Total Protein, Urine: NEGATIVE
Urine Glucose: NEGATIVE
Urobilinogen, UA: 0.2
pH: 6.5 (ref 5.0–8.0)

## 2010-12-19 NOTE — Assessment & Plan Note (Signed)
Summary: YEARLY FU MEDICARE #  [id number]   Vital Signs:  Patient profile:   75 year old male Height:      71 inches Weight:      178.38 pounds BMI:     24.97 O2 Sat:      95 % on Room air Temp:     97.7 degrees F oral Pulse rate:   74 / minute BP sitting:   140 / 72  (left arm) Cuff size:   regular  Vitals Entered By: Zella Ball Ewing CMA Duncan Dull) (December 14, 2010 9:26 AM)  O2 Flow:  Room air  Preventive Care Screening  Last Flu Shot:    Date:  08/01/2010    Results:  given   CC: Yearly/RE   CC:  Yearly/RE.  History of Present Illness: here to f/u;  did see urology Dr Patsi Sears mar 12; no change in tx or need for further eval but did have PSA and UA per pt;   was found to have cyst to left renal but no change;   overall o/w doing ok - symbicort did not seem to help, however;  recent PFT's with mod COPD but little response to b-dilator;  Pt denies CP, worsening sob, doe, wheezing, orthopnea, pnd, worsening LE edema, palps, dizziness or syncope  Pt denies new neuro symptoms such as headache, facial or extremity weakness  Pt denies polydipsia, polyuria,  Overall good compliance with meds, trying to follow low chol diet, wt stable, little excercise however   Denies worsening depressive symptoms, suicidal ideation, or panic, though has some ongoing anxiety.  No fever, wt loss, night sweats, loss of appetite or other constitutional symptoms   Overall good compliance with meds, and good tolerability.  Pt states good ability with ADL's, low fall risk, home safety reviewed and adequate, no significant change in hearing or vision, trying to follow lower chol diet, and occasionally active only with regular excercise - walks 30 min 3 times per wk  Problems Prior to Update: 1)  Solitary Pulmonary Nodule  (ICD-793.11) 2)  Wheezing  (ICD-786.07) 3)  Preventive Health Care  (ICD-V70.0) 4)  COPD  (ICD-496) 5)  Dizziness  (ICD-780.4) 6)  Glucose Intolerance  (ICD-271.3) 7)  Peripheral Vascular Disease   (ICD-443.9) 8)  Allergic Rhinitis  (ICD-477.9) 9)  Diverticulosis, Colon  (ICD-562.10) 10)  Glucose Intolerance  (ICD-271.3) 11)  Fatigue  (ICD-780.79) 12)  Rotator Cuff Repair, Right, Hx of  (ICD-V45.89) 13)  Diverticulosis, Colon, Hx of  (ICD-V12.79) 14)  Degenerative Joint Disease, Cervical Spine  (ICD-721.90) 15)  Cataracts, Bilateral, Hx of  (ICD-V12.40) 16)  Coronary Artery Disease  (ICD-414.00) 17)  Gerd  (ICD-530.81) 18)  Carpal Tunnel Release, Hx of  (ICD-V45.89) 19)  Hemorrhoidectomy, Hx of  (ICD-V45.89) 20)  Appendectomy, Hx of  (ICD-V45.79) 21)  Tonsillectomy, Hx of  (ICD-V45.79) 22)  Benign Prostatic Hypertrophy, Hx of  (ICD-V13.8) 23)  Arthritis  (ICD-716.90) 24)  Hyperlipidemia  (ICD-272.4) 25)  Hypertension  (ICD-401.9)  Medications Prior to Update: 1)  Simvastatin 40 Mg  Tabs (Simvastatin) .Marland Kitchen.. 1 By Mouth Once Daily 2)  Dovonex 0.005 %  Crea (Calcipotriene) .... Use Daily 3)  Glucosamine  Tabs .... Take One Tablet Once Daily 4)  Altace 10 Mg  Tabs (Ramipril) .... Take One Tablet Twice Daily (Daw) 5)  Levitra 20 Mg Tabs (Vardenafil Hcl) .Marland Kitchen.. 1po Once Daily As Needed 6)  Nasacort Aq 55 Mcg/act  Aers (Triamcinolone Acetonide(Nasal)) .... 2 Spray/side Once Daily 7)  Nexium 40 Mg  Cpdr (Esomeprazole Magnesium) .Marland Kitchen.. 1 By Mouth Once Daily 8)  Ecotrin Low Strength 81 Mg  Tbec (Aspirin) .Marland Kitchen.. 1po Qd 9)  Meclizine Hcl 12.5 Mg Tabs (Meclizine Hcl) .Marland Kitchen.. 1 -2 By Mouth Q 6 Hrs As Needed Dizzy 10)  Levofloxacin 500 Mg Tabs (Levofloxacin) .Marland Kitchen.. 1po Once Daily 11)  Prednisone 10 Mg Tabs (Prednisone) .... 3po Qd For 3days, Then 2po Qd For 3days, Then 1po Qd For 3days, Then Stop 12)  Tessalon Perles 100 Mg Caps (Benzonatate) .Marland Kitchen.. 1-2 By Mouth Three Times A Day As Needed Cough 13)  Symbicort 160-4.5 Mcg/act Aero (Budesonide-Formoterol Fumarate) .... 2 Puffs Two Times A Day  Current Medications (verified): 1)  Simvastatin 40 Mg  Tabs (Simvastatin) .Marland Kitchen.. 1 By Mouth Once Daily 2)   Glucosamine  Tabs .... Take One Tablet Once Daily 3)  Altace 10 Mg  Tabs (Ramipril) .... Take One Tablet Twice Daily (Daw) 4)  Levitra 20 Mg Tabs (Vardenafil Hcl) .Marland Kitchen.. 1po Once Daily As Needed 5)  Nasacort Aq 55 Mcg/act  Aers (Triamcinolone Acetonide(Nasal)) .... 2 Spray/side Once Daily 6)  Nexium 40 Mg  Cpdr (Esomeprazole Magnesium) .Marland Kitchen.. 1 By Mouth Once Daily 7)  Ecotrin Low Strength 81 Mg  Tbec (Aspirin) .Marland Kitchen.. 1po Qd 8)  Spiriva Handihaler 18 Mcg Caps (Tiotropium Bromide Monohydrate) .Marland Kitchen.. 1 Puff Once Daily  Allergies (verified): 1)  ! Imdur 2)  ! 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 hx of prostate nodule COPD  Review of Systems       all otherwise negative per pt -    Physical Exam  General:  alert and well-developed. Head:  normocephalic and atraumatic.   Eyes:  vision grossly intact, pupils equal, and pupils round.   Ears:  R ear  normal and L ear normal.   Nose:  no external deformity and no nasal discharge.   Mouth:  no gingival abnormalities and pharynx pink and moist.   Neck:  supple and no masses.   Lungs:  normal respiratory effort, R decreased breath sounds, and L decreased breath sounds  but no rales or wheezing.   Heart:  normal rate and regular rhythm.   Abdomen:  soft, non-tender, and normal bowel sounds.   Msk:  no joint tenderness and no joint swelling.   Extremities:  no edema, no erythema  Neurologic:  cranial nerves II-XII intact, strength normal in all extremities, and gait normal.   Skin:  color normal and no rashes.   Psych:  not depressed appearing and slightly anxious.     Impression & Recommendations:  Problem # 1:  COPD (ICD-496)  The following medications were removed from the medication list:    Symbicort 160-4.5 Mcg/act Aero (Budesonide-formoterol fumarate) .Marland KitchenMarland KitchenMarland KitchenMarland Kitchen  2 puffs two times a day His updated medication list for this problem includes:    Spiriva Handihaler 18 Mcg Caps (Tiotropium bromide monohydrate) .Marland Kitchen... 1 puff once daily symbicort no help; recent PFT's reviewed with mod copd -  ok for spiriva trial  Pulmonary Functions Reviewed: O2 sat: 95 (12/14/2010)     Vaccines Reviewed: Pneumovax: Pneumovax (12/03/2006)   Flu Vax: given (08/01/2010)  Problem # 2:  FATIGUE (ICD-780.79)  exam benign, to check labs below; follow with expectant management    Orders: TLB-BMP (Basic Metabolic Panel-BMET) (80048-METABOL) TLB-CBC Platelet - w/Differential (85025-CBCD) TLB-Hepatic/Liver Function Pnl (80076-HEPATIC) TLB-TSH (Thyroid Stimulating Hormone) (84443-TSH)  Problem # 3:  HYPERLIPIDEMIA (ICD-272.4)  His updated medication list for this problem includes:    Simvastatin 40 Mg Tabs (Simvastatin) .Marland Kitchen... 1 by mouth once daily  Labs Reviewed: SGOT: 29 (12/09/2009)   SGPT: 31 (12/09/2009)   HDL:49.60 (12/09/2009), 46.2 (12/08/2008)  LDL:57 (12/09/2009), 83 (12/08/2008)  Chol:121  (12/09/2009), 141 (12/08/2008)  Trig:73.0 (12/09/2009), 58 (12/08/2008) stable overall by hx and exam, ok to continue meds/tx as is ,  Pt to continue diet efforts, good med tolerance; to check labs - goal LDL less than 70   Orders: TLB-Lipid Panel (80061-LIPID)  Problem # 4:  HYPERTENSION (ICD-401.9)  His updated medication list for this problem includes:    Altace 10 Mg Tabs (Ramipril) .Marland Kitchen... Take one tablet twice daily (daw)  BP today: 140/72 Prior BP: 110/78 (09/29/2010)  Labs Reviewed: K+: 4.7 (12/09/2009) Creat: : 1.0 (12/09/2009)   Chol: 121 (12/09/2009)   HDL: 49.60 (12/09/2009)   LDL: 57 (12/09/2009)   TG: 73.0 (12/09/2009) stable overall by hx and exam, ok to continue meds/tx as is   Orders: TLB-Udip ONLY (81003-UDIP)  Complete Medication List: 1)  Simvastatin 40 Mg Tabs (Simvastatin) .Marland Kitchen.. 1 by mouth once daily 2)  Glucosamine Tabs  .... Take one tablet once daily 3)  Altace 10 Mg Tabs (Ramipril) .... Take one tablet twice daily (daw) 4)  Levitra 20 Mg Tabs (Vardenafil hcl) .Marland Kitchen.. 1po once daily as needed 5)  Nasacort Aq 55 Mcg/act Aers (Triamcinolone acetonide(nasal)) .... 2 spray/side once daily 6)  Nexium 40 Mg Cpdr (Esomeprazole magnesium) .Marland Kitchen.. 1 by mouth once daily 7)  Ecotrin Low Strength 81 Mg Tbec (Aspirin) .Marland Kitchen.. 1po qd 8)  Spiriva Handihaler 18 Mcg Caps (Tiotropium bromide monohydrate) .Marland Kitchen.. 1 puff once daily  Other Orders: TLB-A1C / Hgb A1C (Glycohemoglobin) (83036-A1C)  Patient Instructions: 1)  stop the symbicort 2)  start the spiriva daily as prescribed 3)  Continue all previous medications as before this visit  4)  You are given the refills today 5)  Please go to the Lab in the basement for your blood and/or urine tests today 6)  Please call the number on the [facility] Card for results of your testing  7)  Please schedule a follow-up appointment in 1 year, or sooner if needed Prescriptions: NASACORT AQ 55 MCG/ACT  AERS (TRIAMCINOLONE ACETONIDE(NASAL)) 2  spray/side once daily  #3 x 3   Entered and Authorized by:   Corwin Levins MD   Signed by:   Corwin Levins MD on 12/14/2010   Method used:   Print then Give to Patient   RxID:   [id number] NEXIUM 40 MG  CPDR (ESOMEPRAZOLE MAGNESIUM) 1 by mouth once daily  #90 x 3   Entered and Authorized by:   Corwin Levins MD   Signed by:   Corwin Levins MD on 12/14/2010   Method  used:   Print then Give to Patient   RxID:   [id number] ALTACE 10 MG  TABS (RAMIPRIL) Take one tablet twice daily (DAW)  #180 x 3   Entered and Authorized by:   Corwin Levins MD   Signed by:   Corwin Levins MD on 12/14/2010   Method used:   Print then Give to Patient   RxID:   [id number] SIMVASTATIN 40 MG  TABS (SIMVASTATIN) 1 by mouth once daily  #90 x 3   Entered and Authorized by:   Corwin Levins MD   Signed by:   Corwin Levins MD on 12/14/2010   Method used:   Print then Give to Patient   RxID:   [id number] SPIRIVA HANDIHALER 18 MCG CAPS (TIOTROPIUM BROMIDE MONOHYDRATE) 1 puff once daily  #90 x 3   Entered and Authorized by:   Corwin Levins MD   Signed by:   Corwin Levins MD on 12/14/2010   Method used:   Print then Give to Patient   RxID:   [id number]    Orders Added: 1)  TLB-BMP (Basic Metabolic Panel-BMET) [80048-METABOL] 2)  TLB-CBC Platelet - w/Differential [85025-CBCD] 3)  TLB-Hepatic/Liver Function Pnl [80076-HEPATIC] 4)  TLB-TSH (Thyroid Stimulating Hormone) [84443-TSH] 5)  TLB-A1C / Hgb A1C (Glycohemoglobin) [83036-A1C] 6)  TLB-Lipid Panel [80061-LIPID] 7)  TLB-Udip ONLY [81003-UDIP] 8)  Est. Patient Level IV [14431]

## 2010-12-23 ENCOUNTER — Other Ambulatory Visit: Payer: Self-pay

## 2010-12-23 MED ORDER — RAMIPRIL 10 MG PO CAPS: 10.0000 mg | ORAL_CAPSULE | Freq: Two times a day (BID) | ORAL | Status: DC

## 2010-12-23 NOTE — Telephone Encounter (Signed)
Received fax from [facility] for Ramipril 10 mg 1 by mouth two times daily. Called patient to confirm correct pharmacy.

## 2011-01-02 ENCOUNTER — Other Ambulatory Visit: Payer: Self-pay | Admitting: Internal Medicine

## 2011-01-02 DIAGNOSIS — I6529 Occlusion and stenosis of unspecified carotid artery: Secondary | ICD-10-CM

## 2011-01-03 ENCOUNTER — Other Ambulatory Visit: Payer: Self-pay | Admitting: *Deleted

## 2011-01-03 ENCOUNTER — Encounter (INDEPENDENT_AMBULATORY_CARE_PROVIDER_SITE_OTHER): Payer: Medicare Other | Admitting: *Deleted

## 2011-01-03 DIAGNOSIS — I6529 Occlusion and stenosis of unspecified carotid artery: Secondary | ICD-10-CM

## 2011-01-08 ENCOUNTER — Encounter: Payer: Self-pay | Admitting: Internal Medicine

## 2011-01-08 NOTE — Progress Notes (Signed)
Quick Note:  Voice message left on PhoneTree system - lab is negative, normal or otherwise stable, pt to continue same tx ______ 

## 2011-02-13 NOTE — Assessment & Plan Note (Signed)
[facility]                        [facility] OFFICE NOTE   Calvin Pugh                        MRN:          [id number]  DATE:09/26/2009                            DOB:          August 01, 1936    CHIEF COMPLAINT:  Dizziness.   HISTORY OF PRESENT ILLNESS:  Mr. Calvin Pugh is a 75 year old white male with  past medical history significant for hypertension, hyperlipidemia,  reflux, who is presenting with 3-week history of dizziness after  standing.  The patient states that approximately 3 weeks ago, he noticed  that when he got out of bed in the morning he had acute onset of  dizziness.  There was no frank vertigo.  There was no numbness or  weakness in any of his extremities.  He was slightly nauseous.  The  patient states he was dizzy for approximately 1-1/2 days before it  completely resolved.  Since then, he has had dizziness upon standing  every morning and after most of his naps.  The dizziness usually  resolves within 15 minutes of walking around.  He denies any other  episodes of dizziness not related to raising from the supine position.  He also denies any headaches, diplopia or persistent blurry vision.  He  had slight blurry vision during that first episode 3 weeks ago.  Of  note, the patient states that he put a bobby pin in his right ear 2 days  prior to this first episode of dizziness along with some hydrogen  peroxide.  He denies any pain in his ears.  The patient states that he  had an episode that was similar to this several years ago and a  neurologic workup at that time was negative.   PAST MEDICAL HISTORY:  As above in HPI.   SOCIAL HISTORY:  No tobacco and no excessive alcohol.   FAMILY HISTORY:  Negative for premature coronary artery disease.   ALLERGIES:  No known drug allergies, although CODEINE does cause nausea.   MEDICATIONS:  1. Aspirin 81 mg daily.  2. Zocor 40 mg every night.  3. Altace 20 mg daily.  4.  Nexium 40 mg daily.  5. Glucosamine 500 mg daily.  6. Nasacort p.r.n.   REVIEW OF SYSTEMS:  As in HPI.  The patient specifically denies any  syncope, chest discomfort, or lower extremity edema.  Other systems as  in HPI, otherwise negative.   PHYSICAL EXAMINATION:  VITAL SIGNS:  The patient's blood pressure is  174/89, although he brings in blood pressure log with him today that  shows very clear systolic blood pressures ranging from about 126-148,  his pulse is 81, he weighs 185 pounds, and he is sating 96% on room air.  Orthostatics were also checked and were negative.  GENERAL:  No acute distress.  HEENT:  Normocephalic, atraumatic.  NECK:  Supple.  There are no carotid bruits.  There is no JVD.  HEART:  Regular rate and rhythm without murmur, rub, or gallop.  LUNGS:  Clear bilaterally.  ABDOMEN:  Soft, nontender, nondistended.  EXTREMITIES:  Without edema.  NEURO:  Nonfocal.  Cranial  nerves II through XII are intact.  When  arising from the supine to seated position turning the head during the  process sitting up does reproduce some of the dizziness.  MUSCULOSKELETAL:  5/5 bilateral upper and lower extremity strength.  SKIN:  Warm and dry.  PSYCHIATRIC:  The patient is appropriate with normal levels of insight.   EKG from today independently interpreted by myself demonstrates normal  sinus rhythm with occasional premature atrial contractions.  There is  possible limb lead reversible versus a left posterior fascicular block.  There is nonspecific T-wave abnormalities.  Review of carotid Dopplers  from March of this year shows less than 40% bilateral internal carotid  artery stenosis.  Review of a chest x-ray dated December 10, no acute  findings.  Hemoglobin A1c on the same date is 5.8, BMP 140, potassium  4.5, chloride 102, CO2 30, BUN 13, creatinine 1.1, glucose 123, white  count 6.2, hemoglobin 16, hematocrit 47, platelet count 193.  From March  of this year, his LDL was 21,  his HDL was 46, and his triglyceride was  58.  He had a normal folate and B12 at that time as well.  The patient  states he had a left heart catheterization in 1998.  The records are not  currently available, but he had nonobstructive disease at that time.   ASSESSMENT:  A 75 year old white male with hypertension and  hyperlipidemia presenting with recurrent dizziness that appears to be  neurologic in origin.  This is likely to be benign positional vertigo.  However, a cardiac etiologya remains possible.   PLAN:  Today, we will order a transthoracic echocardiogram to evaluate  the patient's cardiac structure and function.  We also placed the  patient on a 48-hour Holter monitor to assure he is not having any  underlying arrhythmia which may contributing to the dizziness.  We will  contact the patient once the results of these studies are obtained.  If  his symptoms persist without a clear cardiac etiology, we will make a  referral to neurology for what is most likely benign positional vertigo.     Calvin El, MD  Electronically Signed    SGA/MedQ  DD: 09/26/2009  DT: 09/27/2009  Job #: [id number]   cc:   Corwin Levins, MD

## 2011-03-18 ENCOUNTER — Encounter: Payer: Self-pay | Admitting: Internal Medicine

## 2011-03-18 DIAGNOSIS — R7302 Impaired glucose tolerance (oral): Secondary | ICD-10-CM | POA: Insufficient documentation

## 2011-03-18 DIAGNOSIS — Z0000 Encounter for general adult medical examination without abnormal findings: Secondary | ICD-10-CM | POA: Insufficient documentation

## 2011-03-19 ENCOUNTER — Ambulatory Visit (INDEPENDENT_AMBULATORY_CARE_PROVIDER_SITE_OTHER): Payer: Medicare Other | Admitting: Internal Medicine

## 2011-03-19 ENCOUNTER — Other Ambulatory Visit (INDEPENDENT_AMBULATORY_CARE_PROVIDER_SITE_OTHER): Payer: Medicare Other

## 2011-03-19 ENCOUNTER — Encounter: Payer: Self-pay | Admitting: Internal Medicine

## 2011-03-19 VITALS — BP 122/80 | HR 63 | Temp 97.5°F | Ht 71.0 in | Wt 180.0 lb

## 2011-03-19 DIAGNOSIS — R1032 Left lower quadrant pain: Secondary | ICD-10-CM

## 2011-03-19 DIAGNOSIS — R7309 Other abnormal glucose: Secondary | ICD-10-CM

## 2011-03-19 DIAGNOSIS — R7302 Impaired glucose tolerance (oral): Secondary | ICD-10-CM

## 2011-03-19 DIAGNOSIS — I10 Essential (primary) hypertension: Secondary | ICD-10-CM

## 2011-03-19 LAB — BASIC METABOLIC PANEL
BUN: 13 mg/dL (ref 6–23)
CO2: 28 mEq/L (ref 19–32)
Calcium: 9.4 mg/dL (ref 8.4–10.5)
Chloride: 95 mEq/L — ABNORMAL LOW (ref 96–112)
Creatinine, Ser: 0.9 mg/dL (ref 0.4–1.5)

## 2011-03-19 LAB — CBC WITH DIFFERENTIAL/PLATELET
Basophils Relative: 0.6 % (ref 0.0–3.0)
Eosinophils Absolute: 0.2 10*3/uL (ref 0.0–0.7)
Eosinophils Relative: 3.6 % (ref 0.0–5.0)
Hemoglobin: 17 g/dL (ref 13.0–17.0)
Lymphocytes Relative: 17.6 % (ref 12.0–46.0)
Monocytes Relative: 9.9 % (ref 3.0–12.0)
Neutro Abs: 4.6 10*3/uL (ref 1.4–7.7)
Neutrophils Relative %: 68.3 % (ref 43.0–77.0)
RBC: 5.31 Mil/uL (ref 4.22–5.81)
WBC: 6.7 10*3/uL (ref 4.5–10.5)

## 2011-03-19 LAB — URINALYSIS, ROUTINE W REFLEX MICROSCOPIC
Hgb urine dipstick: NEGATIVE
Nitrite: NEGATIVE
Specific Gravity, Urine: 1.01 (ref 1.000–1.030)
Total Protein, Urine: NEGATIVE
Urine Glucose: NEGATIVE
pH: 7 (ref 5.0–8.0)

## 2011-03-19 LAB — HEPATIC FUNCTION PANEL
ALT: 25 U/L (ref 0–53)
Albumin: 4.4 g/dL (ref 3.5–5.2)
Bilirubin, Direct: 0.1 mg/dL (ref 0.0–0.3)
Total Protein: 7.9 g/dL (ref 6.0–8.3)

## 2011-03-19 LAB — HEMOGLOBIN A1C: Hgb A1c MFr Bld: 6.2 % (ref 4.6–6.5)

## 2011-03-19 NOTE — Patient Instructions (Addendum)
Please go to LAB in the Basement for the blood and/or urine tests to be done today Please call the phone number [phone] (the PhoneTree System) for results of testing in 2-3 days;  When calling, simply dial the number, and when prompted enter the MRN number above (the Medical Record Number) and the # key, then the message should start. You will be contacted regarding the referral for: GI - Dr Charm Barges, [place] Please return in 6 months, or sooner if needed

## 2011-03-19 NOTE — Assessment & Plan Note (Addendum)
Exam with mild tenderness but o/w benign, wife very concerned due to chronicity and pt "just not himself", will ask for routine labs, and pt requests referral to GI in [place] - Dr Charm Barges, since he remembers had to wait 3 hours for his colonscopy when done here last;  I suspect his discomfortis primarily MSK but will defer to pt wishes

## 2011-03-19 NOTE — Progress Notes (Signed)
Quick Note:  Voice message left on PhoneTree system - lab is negative, normal or otherwise stable, pt to continue same tx ______ 

## 2011-03-19 NOTE — Progress Notes (Signed)
Subjective:    Patient ID: Calvin Pugh, male    DOB: August 11, 1936, 75 y.o.   MRN: [id number]  HPI   Here with just over 4 wks left sided abd/side pain just above the left ischium,  No fever, mild to mod, sore all the time, dull, no radiation, no bowel changes, no n/v, wt loss, no local rash or swelling..  Has known diverticulosis, but no hx of diverticulitis.   Has BM approx 4-5 times per day. Just recently has microwave procedure for BPH per ursology/dr tannenbaum, and pt reports good increase in flow, but the pain to left side was prior and persists. Pt continues to have recurring LBP without change in severity, bowel or bladder change, fever, wt loss,  worsening LE pain/numbness/weakness, gait change or falls, but has been acting more up lately, had to use the heating pad for 2 mo, which seems to help.   Past Medical History  Diagnosis Date  . ALLERGIC RHINITIS 12/08/2007  . ARTHRITIS 07/25/2007  . BENIGN PROSTATIC HYPERTROPHY, HX OF 07/25/2007  . CATARACTS, BILATERAL, HX OF 07/25/2007  . COPD 09/09/2009  . CORONARY ARTERY DISEASE 07/25/2007  . DEGENERATIVE JOINT DISEASE, CERVICAL SPINE 07/25/2007  . DIVERTICULOSIS, COLON, HX OF 07/25/2007  . DIVERTICULOSIS, COLON 12/08/2007  . DIZZINESS 09/08/2009  . FATIGUE 12/08/2007  . GERD 07/25/2007  . GLUCOSE INTOLERANCE 12/08/2007  . HYPERLIPIDEMIA 07/25/2007  . HYPERTENSION 07/25/2007  . Other acquired absence of organ 07/25/2007  . Other postprocedural status 07/25/2007  . PERIPHERAL VASCULAR DISEASE 12/08/2007  . Solitary pulmonary nodule 09/29/2010  . Wheezing 09/29/2010   Past Surgical History  Procedure Date  . Rotator cuff repair right   . Hemorrhoid surgery   . Appendectomy   . Tonsillectomy   . Carpal tunnel release   . Cataract extraction     reports that he has quit smoking. He does not have any smokeless tobacco history on file. He reports that he drinks alcohol. He reports that he does not use illicit drugs. family history includes  COPD in his father; Cancer in his father; Heart disease in his mother; and Hypertension in his sister. Allergies  Allergen Reactions  . Codeine     REACTION: causes nausea  . Isosorbide Mononitrate     REACTION: severe h/a   Current Outpatient Prescriptions on File Prior to Visit  Medication Sig Dispense Refill  . aspirin 81 MG tablet Take 81 mg by mouth daily.        Marland Kitchen esomeprazole (NEXIUM) 40 MG capsule Take 40 mg by mouth daily.        Marland Kitchen GLUCOSAMINE PO Take by mouth daily.        . ramipril (ALTACE) 10 MG capsule Take 1 capsule (10 mg total) by mouth 2 (two) times daily.  180 capsule  3  . simvastatin (ZOCOR) 40 MG tablet Take 40 mg by mouth daily.        Marland Kitchen triamcinolone (NASACORT AQ) 55 MCG/ACT nasal inhaler Place 2 sprays into the nose daily.        . vardenafil (LEVITRA) 20 MG tablet Take 20 mg by mouth daily as needed.        . tiotropium (SPIRIVA HANDIHALER) 18 MCG inhalation capsule Place 18 mcg into inhaler and inhale daily.         Review of Systems Review of Systems  Constitutional: Negative for diaphoresis and unexpected weight change.  HENT: Negative for drooling and tinnitus.   Eyes: Negative for photophobia and visual disturbance.  Respiratory: Negative for choking and stridor.   Gastrointestinal: Negative for vomiting and blood in stool.  Genitourinary: Negative for hematuria and decreased urine volume.  Musculoskeletal: Negative for gait problem.  Skin: Negative for color change and wound.  Neurological: Negative for tremors and numbness.  Psychiatric/Behavioral: Negative for decreased concentration. The patient is not hyperactive.    Objective:   Physical Exam BP 122/80  Pulse 63  Temp(Src) 97.5 F (36.4 C) (Oral)  Ht 5\' 11"  (1.803 m)  Wt 180 lb (81.647 kg)  BMI 25.10 kg/m2  SpO2 95% Physical Exam  VS noted Constitutional: Pt appears well-developed and well-nourished.  HENT: Head: Normocephalic.  Right Ear: External ear normal.  Left Ear: External ear  normal.  Eyes: Conjunctivae and EOM are normal. Pupils are equal, round, and reactive to light.  Neck: Normal range of motion. Neck supple.  Cardiovascular: Normal rate and regular rhythm.   Pulmonary/Chest: Effort normal and breath sounds normal.  Abd:  Soft, mild tender to deep palpation LLQ, non-distended, + BS, no mass, rebound or guarding Neurological: Pt is alert. No cranial nerve deficit.  Skin: Skin is warm. No erythema.  Psychiatric: Pt behavior is normal. Thought content normal. 1+ nervous        Assessment & Plan:

## 2011-03-25 ENCOUNTER — Encounter: Payer: Self-pay | Admitting: Internal Medicine

## 2011-03-25 NOTE — Assessment & Plan Note (Signed)
stable overall by hx and exam, most recent data reviewed with pt, and pt to continue medical treatment as before  BP Readings from Last 3 Encounters:  03/19/11 122/80  12/14/10 140/72  09/29/10 110/78

## 2011-03-25 NOTE — Assessment & Plan Note (Signed)
stable overall by hx and exam, most recent data reviewed with pt, and pt to continue medical treatment as before  Lab Results  Component Value Date   HGBA1C 6.2 03/19/2011

## 2011-06-25 ENCOUNTER — Encounter: Payer: Self-pay | Admitting: Cardiovascular Disease

## 2011-07-30 ENCOUNTER — Encounter: Payer: Self-pay | Admitting: Cardiovascular Disease

## 2011-09-18 ENCOUNTER — Encounter: Payer: Self-pay | Admitting: Gastroenterology

## 2011-09-18 ENCOUNTER — Ambulatory Visit: Payer: Medicare Other | Admitting: Internal Medicine

## 2011-10-23 DIAGNOSIS — L981 Factitial dermatitis: Secondary | ICD-10-CM | POA: Diagnosis not present

## 2011-10-23 DIAGNOSIS — L259 Unspecified contact dermatitis, unspecified cause: Secondary | ICD-10-CM | POA: Diagnosis not present

## 2011-11-19 DIAGNOSIS — D126 Benign neoplasm of colon, unspecified: Secondary | ICD-10-CM | POA: Diagnosis not present

## 2011-11-19 DIAGNOSIS — Z86010 Personal history of colonic polyps: Secondary | ICD-10-CM | POA: Diagnosis not present

## 2011-11-19 IMAGING — CT CT CHEST W/O CM
2 of 4 series · 15 of 36 positions shown, 18 images · non-contrast
Comparison: Chest radiograph on 09/29/2010

CLINICAL DATA: Right lung nodule seen on recent chest radiograph.
Productive cough.  Former smoker.  COPD.

CT CHEST WITHOUT CONTRAST
TECHNIQUE: Multidetector CT imaging of the chest was performed
following the standard protocol without IV contrast.

[Series 3: lung · axial · 0.80mm/px · z∈[-28,+222]mm · 12 of 60 slices shown, 15 images]
[im 5/60  mediastinal]
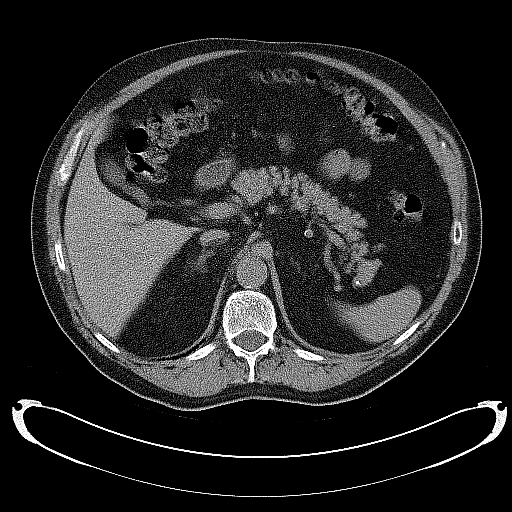
[im 5/60  lung]
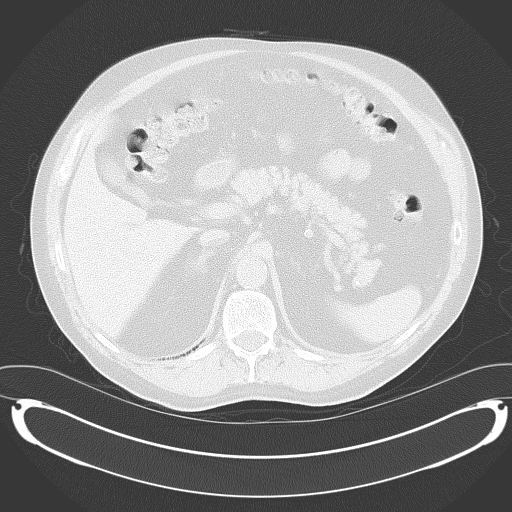
[im 10/60  lung]
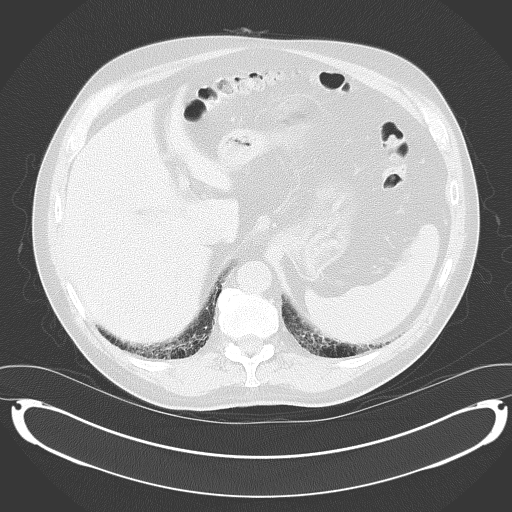
[im 14/60  lung]
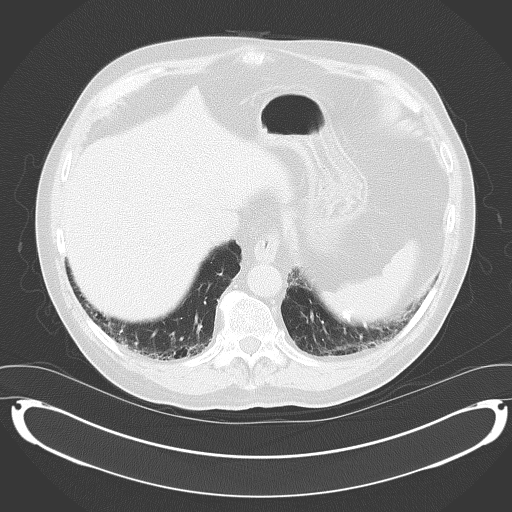
[im 19/60  lung]
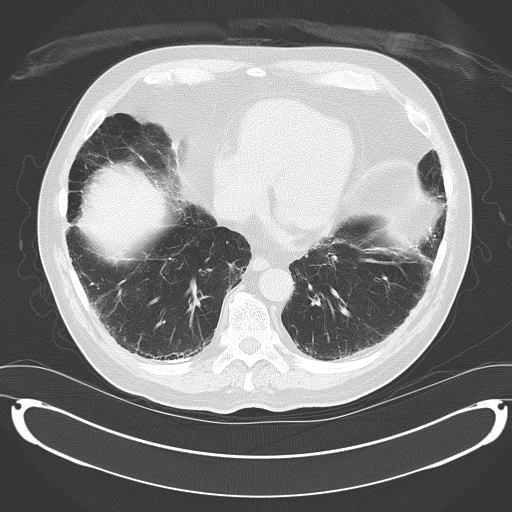
[im 23/60  mediastinal]
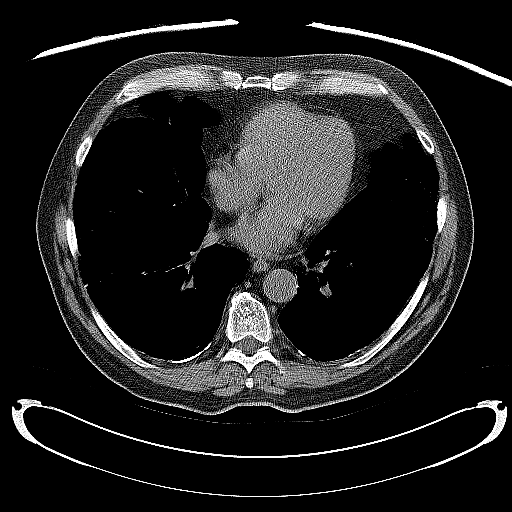
[im 23/60  lung]
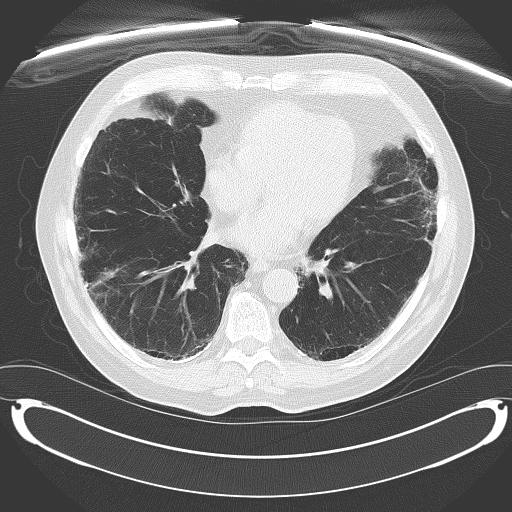
[im 28/60  lung]
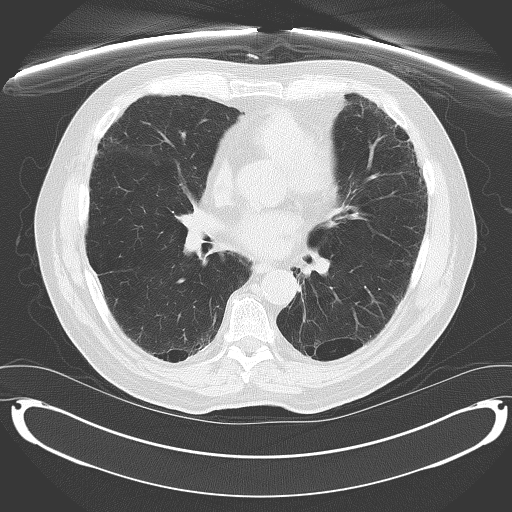
[im 32/60  lung]
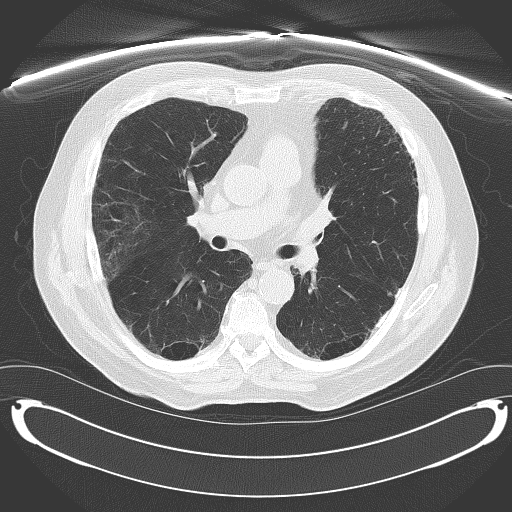
[im 37/60  lung]
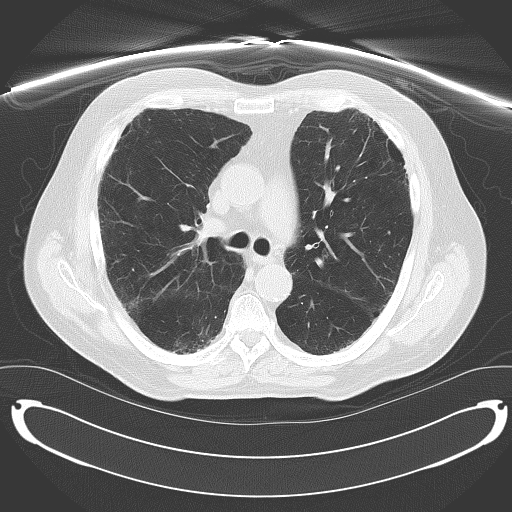
[im 41/60  mediastinal]
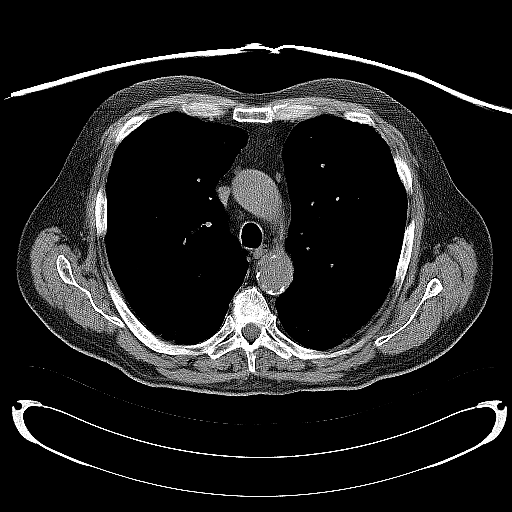
[im 41/60  lung]
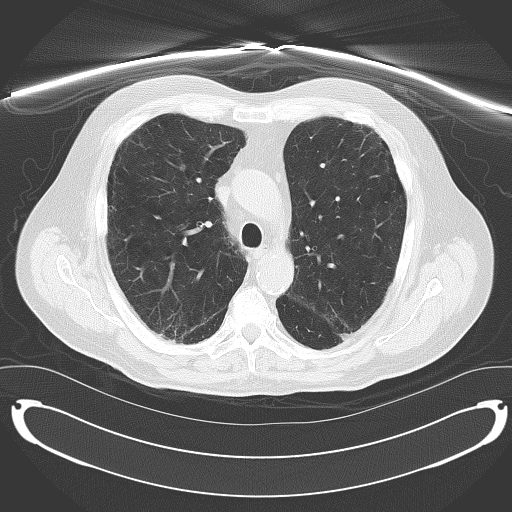
[im 46/60  lung]
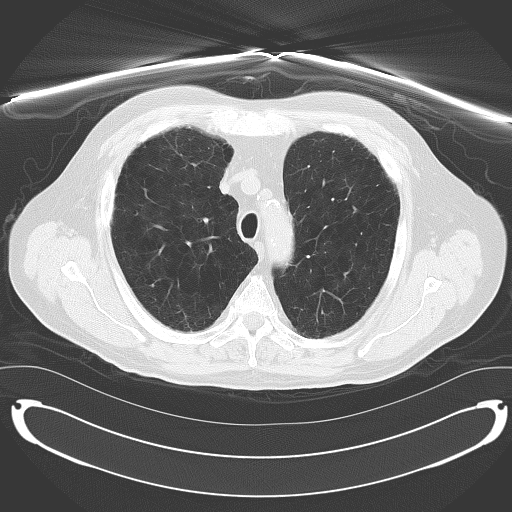
[im 50/60  lung]
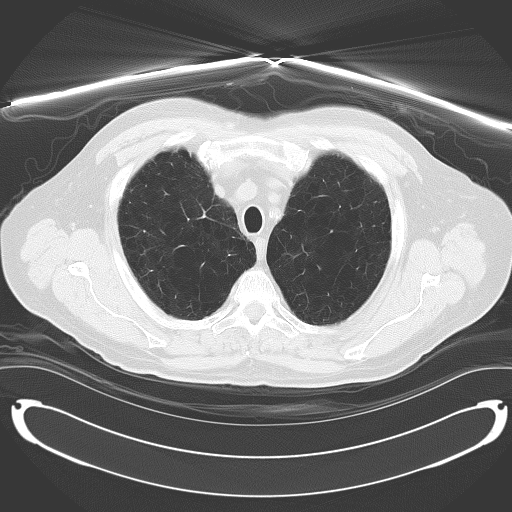
[im 55/60  lung]
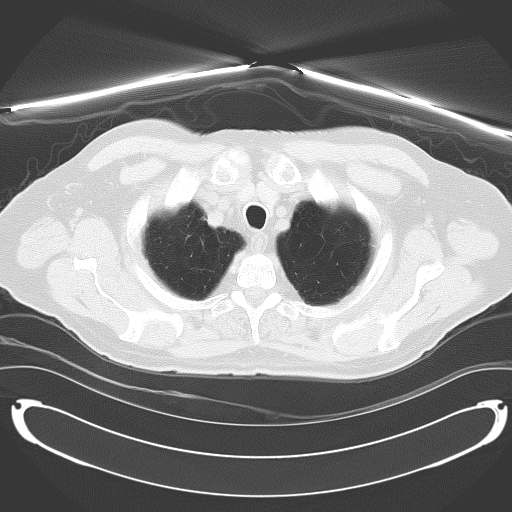

[Series 602: <mpr thick range> · coronal · 0.80mm/px · 3 of 124 slices shown]
[im 25/124  lung]
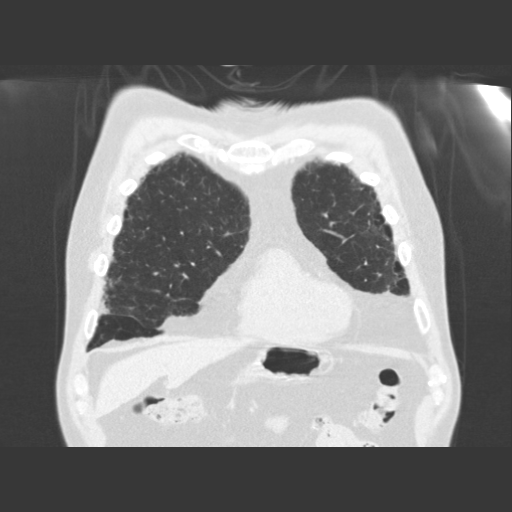
[im 50/124  lung]
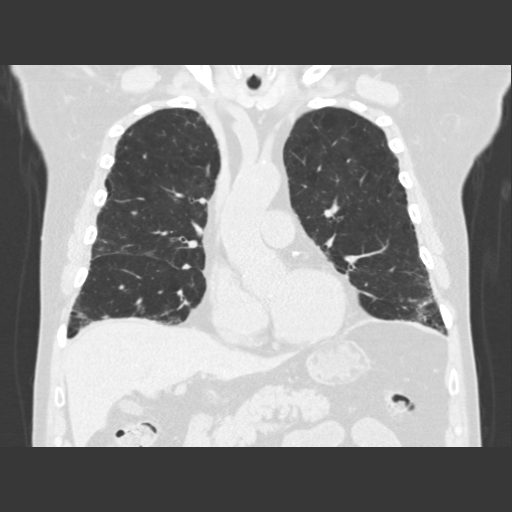
[im 74/124  lung]
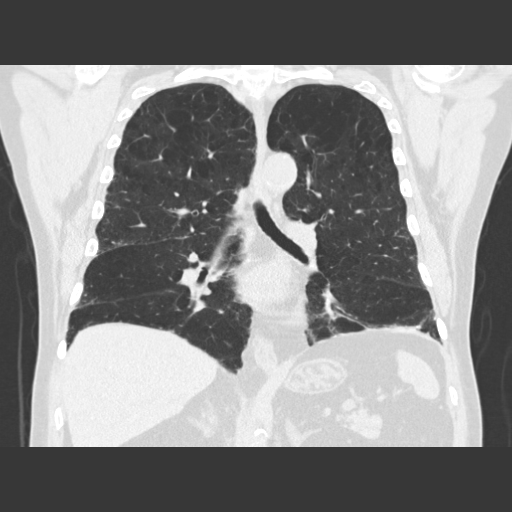

[15 of 36 positions shown; findings below may reference images not displayed]

FINDINGS: Moderate centrilobular pulmonary emphysema is seen, with
paraseptal emphysema also noted in the peripheral subpleural lung
zones.  Parenchymal scarring and mild honeycombing is seen in the
lung bases bilaterally, consistent with mild interstitial fibrosis.
Mild bilateral calcified pleural plaque is also seen, consistent
with previous asbestos exposure.

No suspicious pulmonary nodules or masses are identified.  There is
no evidence of acute infiltrate.  Central  airways are patent.

No evidence of hilar mediastinal masses on this noncontrast study.
No adenopathy seen elsewhere within the thorax.  No evidence of
pleural or pericardial effusion.  The adrenal glands are normal in
appearance.
IMPRESSION: 1.  Moderate pulmonary emphysema and bibasilar scarring.
2.  Bilateral calcified pleural plaque and mild bibasilar pulmonary
interstitial fibrosis, consistent with asbestosis.
3.  No evidence of mass or lymphadenopathy.  No active lung
disease.

## 2011-11-30 DIAGNOSIS — L820 Inflamed seborrheic keratosis: Secondary | ICD-10-CM | POA: Diagnosis not present

## 2011-12-17 ENCOUNTER — Other Ambulatory Visit (INDEPENDENT_AMBULATORY_CARE_PROVIDER_SITE_OTHER): Payer: Medicare Other

## 2011-12-17 ENCOUNTER — Encounter: Payer: Self-pay | Admitting: Internal Medicine

## 2011-12-17 ENCOUNTER — Ambulatory Visit (INDEPENDENT_AMBULATORY_CARE_PROVIDER_SITE_OTHER): Payer: Medicare Other | Admitting: Internal Medicine

## 2011-12-17 VITALS — BP 122/80 | HR 54 | Temp 97.5°F | Ht 71.0 in | Wt 177.0 lb

## 2011-12-17 DIAGNOSIS — R1032 Left lower quadrant pain: Secondary | ICD-10-CM | POA: Diagnosis not present

## 2011-12-17 DIAGNOSIS — R7302 Impaired glucose tolerance (oral): Secondary | ICD-10-CM

## 2011-12-17 DIAGNOSIS — N320 Bladder-neck obstruction: Secondary | ICD-10-CM | POA: Diagnosis not present

## 2011-12-17 DIAGNOSIS — M545 Low back pain, unspecified: Secondary | ICD-10-CM

## 2011-12-17 DIAGNOSIS — R7309 Other abnormal glucose: Secondary | ICD-10-CM

## 2011-12-17 DIAGNOSIS — G8929 Other chronic pain: Secondary | ICD-10-CM | POA: Insufficient documentation

## 2011-12-17 DIAGNOSIS — E785 Hyperlipidemia, unspecified: Secondary | ICD-10-CM

## 2011-12-17 DIAGNOSIS — I10 Essential (primary) hypertension: Secondary | ICD-10-CM

## 2011-12-17 HISTORY — DX: Low back pain, unspecified: M54.50

## 2011-12-17 LAB — URINALYSIS, ROUTINE W REFLEX MICROSCOPIC
Bilirubin Urine: NEGATIVE
Hgb urine dipstick: NEGATIVE
Ketones, ur: NEGATIVE
Total Protein, Urine: NEGATIVE
Urine Glucose: NEGATIVE
Urobilinogen, UA: 0.2 (ref 0.0–1.0)

## 2011-12-17 LAB — CBC WITH DIFFERENTIAL/PLATELET
Basophils Relative: 0.4 % (ref 0.0–3.0)
Eosinophils Absolute: 0.2 10*3/uL (ref 0.0–0.7)
Eosinophils Relative: 2.3 % (ref 0.0–5.0)
HCT: 47.5 % (ref 39.0–52.0)
Lymphs Abs: 1.2 10*3/uL (ref 0.7–4.0)
MCHC: 33.8 g/dL (ref 30.0–36.0)
MCV: 93.4 fl (ref 78.0–100.0)
Monocytes Absolute: 0.7 10*3/uL (ref 0.1–1.0)
Neutro Abs: 5.6 10*3/uL (ref 1.4–7.7)
Neutrophils Relative %: 72.7 % (ref 43.0–77.0)
RBC: 5.08 Mil/uL (ref 4.22–5.81)
WBC: 7.7 10*3/uL (ref 4.5–10.5)

## 2011-12-17 LAB — BASIC METABOLIC PANEL
BUN: 14 mg/dL (ref 6–23)
Creatinine, Ser: 0.9 mg/dL (ref 0.4–1.5)
GFR: 90.13 mL/min (ref >60.00)

## 2011-12-17 LAB — HEPATIC FUNCTION PANEL
ALT: 22 U/L (ref 0–53)
Alkaline Phosphatase: 55 U/L (ref 39–117)
Bilirubin, Direct: 0.1 mg/dL (ref 0.0–0.3)
Total Bilirubin: 0.4 mg/dL (ref 0.3–1.2)
Total Protein: 6.9 g/dL (ref 6.0–8.3)

## 2011-12-17 LAB — LIPID PANEL: Cholesterol: 154 mg/dL (ref 0–200)

## 2011-12-17 MED ORDER — CYCLOBENZAPRINE HCL 5 MG PO TABS
5.0000 mg | ORAL_TABLET | Freq: Three times a day (TID) | ORAL | Status: AC | PRN
Start: 2011-12-17 — End: 2011-12-27

## 2011-12-17 MED ORDER — ESOMEPRAZOLE MAGNESIUM 40 MG PO CPDR: 40.0000 mg | DELAYED_RELEASE_CAPSULE | Freq: Every day | ORAL | Status: DC

## 2011-12-17 MED ORDER — SIMVASTATIN 40 MG PO TABS: 40.0000 mg | ORAL_TABLET | Freq: Every day | ORAL | Status: DC

## 2011-12-17 MED ORDER — RAMIPRIL 10 MG PO CAPS: 10.0000 mg | ORAL_CAPSULE | Freq: Two times a day (BID) | ORAL | Status: DC

## 2011-12-17 MED ORDER — TRIAMCINOLONE ACETONIDE(NASAL) 55 MCG/ACT NA INHA: 2.0000 | Freq: Every day | NASAL | Status: DC

## 2011-12-17 MED ORDER — MECLIZINE HCL 12.5 MG PO TABS: 12.5000 mg | ORAL_TABLET | Freq: Three times a day (TID) | ORAL | Status: AC | PRN

## 2011-12-17 NOTE — Patient Instructions (Addendum)
Take all new medications as prescribed - the flexeril for muscle relaxer Continue all other medications as before Please have the pharmacy call if you need further refills You are up to date with prevention today Please go to LAB in the Basement for the blood and/or urine tests to be done today You will be contacted by phone if any changes need to be made immediately.  Otherwise, you will receive a letter about your results with an explanation. You will be contacted regarding the referral for: orthopedic Please return in 6 months, or sooner if needed

## 2011-12-17 NOTE — Assessment & Plan Note (Signed)
I suspect underlying not yet imaged or diagnosed prob lumbar degenerative changes with recurrent pain, overall mild to mod but some worse recently, nonradicular, exam no change, pt requests ortho referral, and ok to try flexeril prn

## 2011-12-17 NOTE — Assessment & Plan Note (Signed)
stable overall by hx and exam, most recent data reviewed with pt, and pt to continue medical treatment as before, pt reequest f/u labs as well Lab Results  Component Value Date   HGBA1C 6.2 03/19/2011

## 2011-12-17 NOTE — Assessment & Plan Note (Addendum)
stable overall by hx and exam, most recent data reviewed with pt, and pt to continue medical treatment as before  BP Readings from Last 3 Encounters:  12/17/11 122/80  03/19/11 122/80  12/14/10 140/72   ECG reviewed as per emr

## 2011-12-17 NOTE — Progress Notes (Signed)
Subjective:    Patient ID: Calvin Pugh, male    DOB: [birth year], 76 y.o.   MRN: [id number]  HPI  Here to f/u;  Overall doing ok  ,  Pt denies chest pain, increased sob or doe, wheezing, orthopnea, PND, increased LE swelling, palpitations, dizziness or syncope.  Pt denies new neurological symptoms such as new headache, or facial or extremity weakness or numbness   Pt denies polydipsia, polyuria, Pt states overall good compliance with meds, trying to follow lower cholesterol, diabetic diet, wt overall stable but little exercise however, due to Pt continues to have recurring LBP with some increase in severity and freqeuncy, bowel or bladder change, fever, wt loss,  worsening LE pain/numbness/weakness, gait change or falls.  Also c/o ongoing senstive/tender area left lower abd, ? Radiating pain but also sharp and tearing several months ago, but persistent so bothers him.. Did have episode diverticulitits July 2012 confirmed on CT at [facility] but no fever or bowel change or blood.  Denies worsening depressive symptoms, suicidal ideation, or panic. Denies worsening prostatism or slower flow, nocturia Past Medical History  Diagnosis Date  . ALLERGIC RHINITIS 12/08/2007  . ARTHRITIS 07/25/2007  . BENIGN PROSTATIC HYPERTROPHY, HX OF 07/25/2007  . CATARACTS, BILATERAL, HX OF 07/25/2007  . COPD 09/09/2009  . CORONARY ARTERY DISEASE 07/25/2007  . DEGENERATIVE JOINT DISEASE, CERVICAL SPINE 07/25/2007  . DIVERTICULOSIS, COLON, HX OF 07/25/2007  . DIVERTICULOSIS, COLON 12/08/2007  . DIZZINESS 09/08/2009  . FATIGUE 12/08/2007  . GERD 07/25/2007  . GLUCOSE INTOLERANCE 12/08/2007  . HYPERLIPIDEMIA 07/25/2007  . HYPERTENSION 07/25/2007  . Other acquired absence of organ 07/25/2007  . Other postprocedural status 07/25/2007  . PERIPHERAL VASCULAR DISEASE 12/08/2007  . Solitary pulmonary nodule 09/29/2010  . Wheezing 09/29/2010   Past Surgical History  Procedure Date  . Rotator cuff repair right   . Hemorrhoid  surgery   . Appendectomy   . Tonsillectomy   . Carpal tunnel release   . Cataract extraction     reports that he has quit smoking. He does not have any smokeless tobacco history on file. He reports that he drinks alcohol. He reports that he does not use illicit drugs. family history includes COPD in his father; Cancer in his father; Heart disease in his mother; and Hypertension in his sister. Allergies  Allergen Reactions  . Codeine     REACTION: causes nausea  . Isosorbide Mononitrate     REACTION: severe h/a   Current Outpatient Prescriptions on File Prior to Visit  Medication Sig Dispense Refill  . aspirin 81 MG tablet Take 81 mg by mouth daily.        Marland Kitchen esomeprazole (NEXIUM) 40 MG capsule Take 40 mg by mouth daily.        . ramipril (ALTACE) 10 MG capsule Take 1 capsule (10 mg total) by mouth 2 (two) times daily.  180 capsule  3  . silodosin (RAPAFLO) 8 MG CAPS capsule Take 8 mg by mouth daily.        . simvastatin (ZOCOR) 40 MG tablet Take 40 mg by mouth daily.        Marland Kitchen tiotropium (SPIRIVA HANDIHALER) 18 MCG inhalation capsule Place 18 mcg into inhaler and inhale daily.        Marland Kitchen triamcinolone (NASACORT AQ) 55 MCG/ACT nasal inhaler Place 2 sprays into the nose daily.        . vardenafil (LEVITRA) 20 MG tablet Take 20 mg by mouth daily as needed.        Marland Kitchen  GLUCOSAMINE PO Take by mouth daily.         Review of Systems Review of Systems  Constitutional: Negative for diaphoresis and unexpected weight change.  HENT: Negative for drooling and tinnitus.   Eyes: Negative for photophobia and visual disturbance.  Respiratory: Negative for choking and stridor.   Gastrointestinal: Negative for vomiting and blood in stool.  Genitourinary: Negative for hematuria and decreased urine volume.  Musculoskeletal: Negative for gait problem.  Skin: Negative for color change and wound.  Neurological: Negative for tremors and numbness.  Psychiatric/Behavioral: Negative for decreased concentration.  The patient is not hyperactive.       Objective:   Physical Exam BP 122/80  Pulse 54  Temp(Src) 97.5 F (36.4 C) (Oral)  Ht 5\' 11"  (1.803 m)  Wt 177 lb (80.287 kg)  BMI 24.69 kg/m2  SpO2 96% Physical Exam  VS noted Constitutional: Pt appears well-developed and well-nourished.  HENT: Head: Normocephalic.  Right Ear: External ear normal.  Left Ear: External ear normal.  Eyes: Conjunctivae and EOM are normal. Pupils are equal, round, and reactive to light.  Neck: Normal range of motion. Neck supple.  Cardiovascular: Normal rate and regular rhythm.   Pulmonary/Chest: Effort normal and breath sounds decreased bilat, no rales or wheeze  Abd:  Soft, NT, non-distended, + BS except mild tender left lateral quad laterally Spine: nontender in the midline, has some bilat lumbar paravertebral tender without rash, swelling, erythema Neurological: Pt is alert. No cranial nerve deficit.  Skin: Skin is warm. No erythema.  Psychiatric: Pt behavior is normal. Thought content normal. 1+ anxious    Assessment & Plan:

## 2011-12-17 NOTE — Assessment & Plan Note (Signed)
stable overall by hx and exam, most recent data reviewed with pt, and pt to continue medical treatment as before Lab Results  Component Value Date   LDLCALC 70 12/14/2010   To re-check labs per pt request, goal ldl < 70

## 2011-12-17 NOTE — Assessment & Plan Note (Signed)
Also due for PSA -will order, o/w stable

## 2011-12-17 NOTE — Assessment & Plan Note (Signed)
?   msk vs lumbar radicular; suspect MSK given recent colonscopy and July 2012 ct abd;  To check labs but hold antibx for recurrent diverticulitis empiric unless fever or hx worsens

## 2011-12-26 DIAGNOSIS — M546 Pain in thoracic spine: Secondary | ICD-10-CM | POA: Diagnosis not present

## 2011-12-26 DIAGNOSIS — M545 Low back pain: Secondary | ICD-10-CM | POA: Diagnosis not present

## 2012-01-03 DIAGNOSIS — B372 Candidiasis of skin and nail: Secondary | ICD-10-CM | POA: Diagnosis not present

## 2012-01-03 DIAGNOSIS — L538 Other specified erythematous conditions: Secondary | ICD-10-CM | POA: Diagnosis not present

## 2012-01-09 DIAGNOSIS — M545 Low back pain: Secondary | ICD-10-CM | POA: Diagnosis not present

## 2012-01-10 DIAGNOSIS — M545 Low back pain: Secondary | ICD-10-CM | POA: Diagnosis not present

## 2012-01-14 DIAGNOSIS — M545 Low back pain: Secondary | ICD-10-CM | POA: Diagnosis not present

## 2012-01-16 DIAGNOSIS — M545 Low back pain: Secondary | ICD-10-CM | POA: Diagnosis not present

## 2012-01-21 DIAGNOSIS — M545 Low back pain: Secondary | ICD-10-CM | POA: Diagnosis not present

## 2012-01-23 DIAGNOSIS — M545 Low back pain: Secondary | ICD-10-CM | POA: Diagnosis not present

## 2012-01-28 DIAGNOSIS — M545 Low back pain: Secondary | ICD-10-CM | POA: Diagnosis not present

## 2012-01-30 DIAGNOSIS — M545 Low back pain: Secondary | ICD-10-CM | POA: Diagnosis not present

## 2012-02-04 DIAGNOSIS — M545 Low back pain: Secondary | ICD-10-CM | POA: Diagnosis not present

## 2012-02-06 DIAGNOSIS — M545 Low back pain: Secondary | ICD-10-CM | POA: Diagnosis not present

## 2012-03-06 DIAGNOSIS — L820 Inflamed seborrheic keratosis: Secondary | ICD-10-CM | POA: Diagnosis not present

## 2012-06-09 ENCOUNTER — Ambulatory Visit (INDEPENDENT_AMBULATORY_CARE_PROVIDER_SITE_OTHER): Payer: Medicare Other | Admitting: Internal Medicine

## 2012-06-09 ENCOUNTER — Encounter: Payer: Self-pay | Admitting: Internal Medicine

## 2012-06-09 VITALS — BP 122/80 | HR 65 | Temp 98.1°F | Ht 71.0 in | Wt 176.1 lb

## 2012-06-09 DIAGNOSIS — Z23 Encounter for immunization: Secondary | ICD-10-CM

## 2012-06-09 DIAGNOSIS — M542 Cervicalgia: Secondary | ICD-10-CM | POA: Diagnosis not present

## 2012-06-09 DIAGNOSIS — I10 Essential (primary) hypertension: Secondary | ICD-10-CM

## 2012-06-09 DIAGNOSIS — E785 Hyperlipidemia, unspecified: Secondary | ICD-10-CM

## 2012-06-09 DIAGNOSIS — R7309 Other abnormal glucose: Secondary | ICD-10-CM | POA: Diagnosis not present

## 2012-06-09 DIAGNOSIS — R7302 Impaired glucose tolerance (oral): Secondary | ICD-10-CM

## 2012-06-09 MED ORDER — ASPIRIN 81 MG PO TABS: 81.0000 mg | ORAL_TABLET | Freq: Every day | ORAL | Status: AC

## 2012-06-09 NOTE — Progress Notes (Signed)
Subjective:    Patient ID: Calvin Pugh, male    DOB: 1936/01/15, 76 y.o.   MRN: [id number]  HPI  Here with left post neck pain, mild to mod, intermittent ,  3 wks, but no bowel or bladder change, fever, wt loss,  worsening UE or LE pain/numbness/weakness, gait change or falls.  No prio imaging, has not been an issue prior. No recent trauma, fever, ST, or cough. Has tried muscle relaxer and may have helped some, but tends to make him sleepy the next day when takes the night before.  Does seem to take more naps during the day as well, but only when he sits to watch tv, does not get sleepy driving.  Has fallen asleep in chair and neck does get stiff. Pt denies chest pain, increased sob or doe, wheezing, orthopnea, PND, increased LE swelling, palpitations, dizziness or syncope.   Pt denies polydipsia, polyuria. Pt denies new neurological symptoms such as new headache, or facial or extremity weakness or numbness  No UE radicular or shoulder symptoms Past Medical History  Diagnosis Date  . ALLERGIC RHINITIS 12/08/2007  . ARTHRITIS 07/25/2007  . BENIGN PROSTATIC HYPERTROPHY, HX OF 07/25/2007  . CATARACTS, BILATERAL, HX OF 07/25/2007  . COPD 09/09/2009  . CORONARY ARTERY DISEASE 07/25/2007  . DEGENERATIVE JOINT DISEASE, CERVICAL SPINE 07/25/2007  . DIVERTICULOSIS, COLON, HX OF 07/25/2007  . DIVERTICULOSIS, COLON 12/08/2007  . DIZZINESS 09/08/2009  . FATIGUE 12/08/2007  . GERD 07/25/2007  . GLUCOSE INTOLERANCE 12/08/2007  . HYPERLIPIDEMIA 07/25/2007  . HYPERTENSION 07/25/2007  . Other acquired absence of organ 07/25/2007  . Other postprocedural status 07/25/2007  . PERIPHERAL VASCULAR DISEASE 12/08/2007  . Solitary pulmonary nodule 09/29/2010  . Wheezing 09/29/2010  . Lower back pain 12/17/2011   Past Surgical History  Procedure Date  . Rotator cuff repair right   . Hemorrhoid surgery   . Appendectomy   . Tonsillectomy   . Carpal tunnel release   . Cataract extraction     reports that he has quit  smoking. He does not have any smokeless tobacco history on file. He reports that he drinks alcohol. He reports that he does not use illicit drugs. family history includes COPD in his father; Cancer in his father; Heart disease in his mother; and Hypertension in his sister. Allergies  Allergen Reactions  . Codeine     REACTION: causes nausea  . Isosorbide Mononitrate     REACTION: severe h/a   Current Outpatient Prescriptions on File Prior to Visit  Medication Sig Dispense Refill  . esomeprazole (NEXIUM) 40 MG capsule Take 1 capsule (40 mg total) by mouth daily.  90 capsule  3  . ramipril (ALTACE) 10 MG capsule Take 1 capsule (10 mg total) by mouth 2 (two) times daily.  180 capsule  3  . simvastatin (ZOCOR) 40 MG tablet Take 1 tablet (40 mg total) by mouth daily.  90 tablet  3  . triamcinolone (NASACORT AQ) 55 MCG/ACT nasal inhaler Place 2 sprays into the nose daily.  3 Inhaler  3   Review of Systems  Constitutional: Negative for diaphoresis and unexpected weight change.   Eyes: Negative for photophobia and visual disturbance.  Respiratory: Negative for choking and stridor.   Gastrointestinal: Negative for vomiting and blood in stool.  Genitourinary: Negative for hematuria and decreased urine volume.  Musculoskeletal: Negative for gait problem.  Skin: Negative for color change and wound.  Neurological: Negative for tremors and numbness.  Objective:   Physical Exam BP  122/80  Pulse 65  Temp 98.1 F (36.7 C) (Oral)  Ht 5\' 11"  (1.803 m)  Wt 176 lb 2 oz (79.89 kg)  BMI 24.56 kg/m2  SpO2 95% Physical Exam  VS noted Constitutional: Pt appears well-developed and well-nourished.  HENT: Head: Normocephalic.  Right Ear: External ear normal.  Left Ear: External ear normal.  Eyes: Conjunctivae and EOM are normal. Pupils are equal, round, and reactive to light.  Neck: Normal range of motion. Neck supple. FROM, has bilat left > right mild tender paracervical, spine nontender  throughout Cardiovascular: Normal rate and regular rhythm.   Pulmonary/Chest: Effort normal and breath sounds normal.  Neurological: Pt is alert. Not confused ; motor/gait/dtr intact Skin: Skin is warm. No erythema.  Psychiatric: Pt behavior is normal. Thought content normal.     Assessment & Plan:

## 2012-06-09 NOTE — Patient Instructions (Addendum)
You had the flu shot today Please take tylenol as needed for your neck pain Please return if the neck pain gets worse, especially if the pain goes to the arm or have numbness or weakness Continue all other medications as before, such as the flexeril as well Please have the pharmacy call with any refills you may need. Please re-start the aspirin 81 mg per day No blood work needed today You are otherwise up to date with prevention today Please return in 6 months, or sooner if needed, as we will need lab work next time

## 2012-06-15 ENCOUNTER — Encounter: Payer: Self-pay | Admitting: Internal Medicine

## 2012-06-15 DIAGNOSIS — M542 Cervicalgia: Secondary | ICD-10-CM | POA: Insufficient documentation

## 2012-06-15 NOTE — Assessment & Plan Note (Addendum)
Mild overall, suspect liekly related to MSK strain vs underlying c-spine djd or ddd;  Exam benign for neuro changes, for tylenol prn, declines flexeril prn , consider further eval and tx for worsening or persistent symptoms or ortho referral, has some flexeril at home for prn use

## 2012-06-15 NOTE — Assessment & Plan Note (Signed)
stable overall by hx and exam, most recent data reviewed with pt, and pt to continue medical treatment as before Lab Results  Component Value Date   LDLCALC 88 12/17/2011

## 2012-06-15 NOTE — Assessment & Plan Note (Signed)
stable overall by hx and exam, most recent data reviewed with pt, and pt to continue medical treatment as before BP Readings from Last 3 Encounters:  06/09/12 122/80  12/17/11 122/80  03/19/11 122/80

## 2012-06-15 NOTE — Assessment & Plan Note (Signed)
stable overall by hx and exam, most recent data reviewed with pt, and pt to continue medical treatment as before Lab Results  Component Value Date   HGBA1C 6.0 12/17/2011

## 2012-09-01 DIAGNOSIS — M779 Enthesopathy, unspecified: Secondary | ICD-10-CM | POA: Diagnosis not present

## 2012-10-06 DIAGNOSIS — L820 Inflamed seborrheic keratosis: Secondary | ICD-10-CM | POA: Diagnosis not present

## 2012-11-17 DIAGNOSIS — L820 Inflamed seborrheic keratosis: Secondary | ICD-10-CM | POA: Diagnosis not present

## 2012-12-02 ENCOUNTER — Other Ambulatory Visit: Payer: Self-pay | Admitting: Internal Medicine

## 2012-12-17 ENCOUNTER — Ambulatory Visit (INDEPENDENT_AMBULATORY_CARE_PROVIDER_SITE_OTHER): Payer: Medicare Other | Admitting: Internal Medicine

## 2012-12-17 ENCOUNTER — Other Ambulatory Visit (INDEPENDENT_AMBULATORY_CARE_PROVIDER_SITE_OTHER): Payer: Medicare Other

## 2012-12-17 ENCOUNTER — Encounter: Payer: Self-pay | Admitting: Internal Medicine

## 2012-12-17 ENCOUNTER — Ambulatory Visit (INDEPENDENT_AMBULATORY_CARE_PROVIDER_SITE_OTHER)
Admission: RE | Admit: 2012-12-17 | Discharge: 2012-12-17 | Disposition: A | Discharge status: Home / Self Care | Payer: Medicare Other | Source: Ambulatory Visit | Attending: Internal Medicine | Admitting: Internal Medicine

## 2012-12-17 VITALS — BP 110/72 | HR 68 | Temp 97.5°F | Ht 71.0 in | Wt 182.7 lb

## 2012-12-17 DIAGNOSIS — Z87891 Personal history of nicotine dependence: Secondary | ICD-10-CM | POA: Diagnosis not present

## 2012-12-17 DIAGNOSIS — N320 Bladder-neck obstruction: Secondary | ICD-10-CM | POA: Diagnosis not present

## 2012-12-17 DIAGNOSIS — E785 Hyperlipidemia, unspecified: Secondary | ICD-10-CM | POA: Diagnosis not present

## 2012-12-17 DIAGNOSIS — R7309 Other abnormal glucose: Secondary | ICD-10-CM

## 2012-12-17 DIAGNOSIS — R0989 Other specified symptoms and signs involving the circulatory and respiratory systems: Secondary | ICD-10-CM | POA: Diagnosis not present

## 2012-12-17 DIAGNOSIS — J449 Chronic obstructive pulmonary disease, unspecified: Secondary | ICD-10-CM | POA: Diagnosis not present

## 2012-12-17 DIAGNOSIS — Z23 Encounter for immunization: Secondary | ICD-10-CM

## 2012-12-17 DIAGNOSIS — R7302 Impaired glucose tolerance (oral): Secondary | ICD-10-CM

## 2012-12-17 DIAGNOSIS — Z136 Encounter for screening for cardiovascular disorders: Secondary | ICD-10-CM | POA: Diagnosis not present

## 2012-12-17 DIAGNOSIS — J4489 Other specified chronic obstructive pulmonary disease: Secondary | ICD-10-CM | POA: Diagnosis not present

## 2012-12-17 DIAGNOSIS — R0609 Other forms of dyspnea: Secondary | ICD-10-CM

## 2012-12-17 DIAGNOSIS — R0600 Dyspnea, unspecified: Secondary | ICD-10-CM | POA: Insufficient documentation

## 2012-12-17 DIAGNOSIS — I10 Essential (primary) hypertension: Secondary | ICD-10-CM

## 2012-12-17 DIAGNOSIS — J8410 Pulmonary fibrosis, unspecified: Secondary | ICD-10-CM | POA: Diagnosis not present

## 2012-12-17 LAB — CBC WITH DIFFERENTIAL/PLATELET
Basophils Relative: 0.6 % (ref 0.0–3.0)
Eosinophils Relative: 4 % (ref 0.0–5.0)
HCT: 48 % (ref 39.0–52.0)
Lymphs Abs: 1.3 10*3/uL (ref 0.7–4.0)
MCV: 91.1 fl (ref 78.0–100.0)
Monocytes Absolute: 0.7 10*3/uL (ref 0.1–1.0)
Neutro Abs: 4.3 10*3/uL (ref 1.4–7.7)
Platelets: 229 10*3/uL (ref 150.0–400.0)
RBC: 5.27 Mil/uL (ref 4.22–5.81)
WBC: 6.6 10*3/uL (ref 4.5–10.5)

## 2012-12-17 LAB — URINALYSIS, ROUTINE W REFLEX MICROSCOPIC
Bilirubin Urine: NEGATIVE
Hgb urine dipstick: NEGATIVE
Leukocytes, UA: NEGATIVE
Nitrite: NEGATIVE
Urobilinogen, UA: 0.2 (ref 0.0–1.0)
pH: 6 (ref 5.0–8.0)

## 2012-12-17 LAB — LIPID PANEL
Cholesterol: 148 mg/dL (ref 0–200)
LDL Cholesterol: 88 mg/dL (ref 0–99)
VLDL: 14.8 mg/dL (ref 0.0–40.0)

## 2012-12-17 LAB — HEPATIC FUNCTION PANEL
AST: 31 U/L (ref 0–37)
Total Bilirubin: 1 mg/dL (ref 0.3–1.2)

## 2012-12-17 LAB — BASIC METABOLIC PANEL
BUN: 18 mg/dL (ref 6–23)
GFR: 65.35 mL/min (ref >60.00)
Potassium: 4.8 mEq/L (ref 3.5–5.1)

## 2012-12-17 MED ORDER — SIMVASTATIN 40 MG PO TABS: 40.0000 mg | ORAL_TABLET | Freq: Every day | ORAL | Status: DC

## 2012-12-17 MED ORDER — MECLIZINE HCL 12.5 MG PO TABS: 12.5000 mg | ORAL_TABLET | Freq: Three times a day (TID) | ORAL | Status: DC | PRN

## 2012-12-17 MED ORDER — ESOMEPRAZOLE MAGNESIUM 40 MG PO CPDR: 40.0000 mg | DELAYED_RELEASE_CAPSULE | Freq: Every day | ORAL | Status: DC

## 2012-12-17 MED ORDER — TRIAMCINOLONE ACETONIDE(NASAL) 55 MCG/ACT NA INHA: 2.0000 | Freq: Every day | NASAL | Status: DC

## 2012-12-17 MED ORDER — MOMETASONE FURO-FORMOTEROL FUM 100-5 MCG/ACT IN AERO: 2.0000 | INHALATION_SPRAY | Freq: Two times a day (BID) | RESPIRATORY_TRACT | Status: DC

## 2012-12-17 MED ORDER — CYCLOBENZAPRINE HCL 5 MG PO TABS: 5.0000 mg | ORAL_TABLET | Freq: Three times a day (TID) | ORAL | Status: DC | PRN

## 2012-12-17 MED ORDER — TIOTROPIUM BROMIDE MONOHYDRATE 18 MCG IN CAPS: 18.0000 ug | ORAL_CAPSULE | Freq: Every day | RESPIRATORY_TRACT | Status: DC

## 2012-12-17 NOTE — Assessment & Plan Note (Signed)
Also for psa as he is due 

## 2012-12-17 NOTE — Assessment & Plan Note (Addendum)
ECG reviewed as per emr, exam most likely c/w mod copd as per prior PFT's, for spiriva trial and dulera asd,  to f/u any worsening symptoms or concerns, for labs today but not likely to need repeat PFT's, echo  Note:  Total time for pt hx, exam, review of record with pt in the room, determination of diagnoses and plan for further eval and tx is > 40 min, with over 50% spent in coordination and counseling of patient

## 2012-12-17 NOTE — Assessment & Plan Note (Signed)
stable overall by history and exam, recent data reviewed with pt, and pt to continue medical treatment as before,  to f/u any worsening symptoms or concerns / Lab Results  Component Value Date   LDLCALC 88 12/17/2012    

## 2012-12-17 NOTE — Assessment & Plan Note (Addendum)
ECG reviewed as per emr, stable overall by history and exam, recent data reviewed with pt, and pt to continue medical treatment as before,  to f/u any worsening symptoms or concerns BP Readings from Last 3 Encounters:  12/17/12 110/72  06/09/12 122/80  12/17/11 122/80

## 2012-12-17 NOTE — Progress Notes (Signed)
Subjective:    Patient ID: Calvin Pugh, male    DOB: [birth year], 77 y.o.   MRN: [id number]  HPI  Here to f/u; overall doing ok,  Pt denies chest pain, increased sob or doe, wheezing, orthopnea, PND, increased LE swelling, palpitations, dizziness or syncope.  Pt denies polydipsia, polyuria, or low sugar symptoms such as weakness or confusion improved with po intake.  Pt denies new neurological symptoms such as new headache, or facial or extremity weakness or numbness.   Pt states overall good compliance with meds, has been trying to follow lower cholesterol diet, with wt overall stable,  but little exercise however.   Does have sense of mild worsening dyspnea, hx of COPD mod by PFT's 2010 with suboptimal but o/w neg Echo,  worse recently but similar to what seemed to be improved with symbicort 2 yrs ago, now out, and rx was too expensive at the time.  Former smoker, 1ppd x 30 yrs, but CT neg at [facility] for AAA July 2012.  Pt continues to have recurring LBP without change in severity, bowel or bladder change, fever, wt loss,  worsening LE pain/numbness/weakness, gait change or falls., has known lumbar DDD/DJD, has seen [place] orth[facility], no surgury needed.  Past Medical History  Diagnosis Date  . ALLERGIC RHINITIS 12/08/2007  . ARTHRITIS 07/25/2007  . BENIGN PROSTATIC HYPERTROPHY, HX OF 07/25/2007  . CATARACTS, BILATERAL, HX OF 07/25/2007  . COPD 09/09/2009  . CORONARY ARTERY DISEASE 07/25/2007  . DEGENERATIVE JOINT DISEASE, CERVICAL SPINE 07/25/2007  . DIVERTICULOSIS, COLON, HX OF 07/25/2007  . DIVERTICULOSIS, COLON 12/08/2007  . DIZZINESS 09/08/2009  . FATIGUE 12/08/2007  . GERD 07/25/2007  . GLUCOSE INTOLERANCE 12/08/2007  . HYPERLIPIDEMIA 07/25/2007  . HYPERTENSION 07/25/2007  . Other acquired absence of organ 07/25/2007  . Other postprocedural status 07/25/2007  . PERIPHERAL VASCULAR DISEASE 12/08/2007  . Solitary pulmonary nodule 09/29/2010  . Wheezing 09/29/2010  . Lower back pain 12/17/2011    Past Surgical History  Procedure Laterality Date  . Rotator cuff repair right    . Hemorrhoid surgery    . Appendectomy    . Tonsillectomy    . Carpal tunnel release    . Cataract extraction      reports that he has quit smoking. He does not have any smokeless tobacco history on file. He reports that  drinks alcohol. He reports that he does not use illicit drugs. family history includes COPD in his father; Cancer in his father; Heart disease in his mother; and Hypertension in his sister. Allergies  Allergen Reactions  . Codeine     REACTION: causes nausea  . Isosorbide Mononitrate     REACTION: severe h/a   Current Outpatient Prescriptions on File Prior to Visit  Medication Sig Dispense Refill  . aspirin 81 MG tablet Take 1 tablet (81 mg total) by mouth daily.  30 tablet  11  . ramipril (ALTACE) 10 MG capsule TAKE 1 CAPSULE BY MOUTH TWICE DAILY  180 capsule  2   No current facility-administered medications on file prior to visit.    Review of Systems  Constitutional: Negative for unexpected weight change, or unusual diaphoresis  HENT: Negative for tinnitus.   Eyes: Negative for photophobia and visual disturbance.  Respiratory: Negative for choking and stridor.   Gastrointestinal: Negative for vomiting and blood in stool.  Genitourinary: Negative for hematuria and decreased urine volume.  Musculoskeletal: Negative for acute joint swelling Skin: Negative for color change and wound.  Neurological: Negative for  tremors and numbness other than noted  Psychiatric/Behavioral: Negative for decreased concentration or  hyperactivity.       Objective:   Physical Exam BP 110/72  Pulse 68  Temp(Src) 97.5 F (36.4 C) (Oral)  Ht 5\' 11"  (1.803 m)  Wt 182 lb 11 oz (82.867 kg)  BMI 25.49 kg/m2  SpO2 95% VS noted,  Constitutional: Pt appears well-developed and well-nourished.  HENT: Head: NCAT.  Right Ear: External ear normal.  Left Ear: External ear normal.  Eyes: Conjunctivae  and EOM are normal. Pupils are equal, round, and reactive to light.  Neck: Normal range of motion. Neck supple.  Cardiovascular: Normal rate and regular rhythm.   Pulmonary/Chest: Effort normal and breath sounds decreased, without rales or wheeze.  Abd:  Soft, NT, non-distended, + BS Neurological: Pt is alert. Not confused  Skin: Skin is warm. No erythema.  Psychiatric: Pt behavior is normal. Thought content normal.     Assessment & Plan:

## 2012-12-17 NOTE — Assessment & Plan Note (Signed)
With mild worsening symptoms, for cxr, hold on repeat PFT's for now or other eval for dyspnea such as echo, gave spiriva sample for one mo daily use trial, as well as sample for dulera only if the spiriva does not seem to help

## 2012-12-17 NOTE — Assessment & Plan Note (Signed)
stable overall by history and exam, recent data reviewed with pt, and pt to continue medical treatment as before,  to f/u any worsening symptoms or concerns Lab Results  Component Value Date   HGBA1C 5.8 12/17/2012    

## 2012-12-17 NOTE — Patient Instructions (Addendum)
You had the Tetanus shot today (Td) Please take all new medication as prescribed  - the spiriva daily as instructed, and add the dulera if needed if the spiriva does not work well enough (and you have the hardcopy prescriptions today) Please continue all other medications as before, and refills have been done if requested - the hardcopies for nexium and simvastatin, as well as the nasacort to [facility] Please go to the XRAY Department in the Basement (go straight as you get off the elevator) for the x-ray testing Please go to the LAB in the Basement (turn left off the elevator) for the tests to be done today You will be contacted by phone if any changes need to be made immediately.  Otherwise, you will receive a letter about your results with an explanation Please continue your efforts at being more active, low cholesterol diet, and weight control. You are otherwise up to date with prevention measures today. Please remember to sign up for My Chart if you have not done so, as this will be important to you in the future with finding out test results, communicating by private email, and scheduling acute appointments online when needed. Please return in 6 months, or sooner if needed

## 2013-01-08 ENCOUNTER — Encounter (INDEPENDENT_AMBULATORY_CARE_PROVIDER_SITE_OTHER): Payer: Medicare Other

## 2013-01-08 DIAGNOSIS — I6529 Occlusion and stenosis of unspecified carotid artery: Secondary | ICD-10-CM | POA: Diagnosis not present

## 2013-03-02 DIAGNOSIS — L600 Ingrowing nail: Secondary | ICD-10-CM | POA: Diagnosis not present

## 2013-03-02 DIAGNOSIS — L03039 Cellulitis of unspecified toe: Secondary | ICD-10-CM | POA: Diagnosis not present

## 2013-04-14 DIAGNOSIS — L820 Inflamed seborrheic keratosis: Secondary | ICD-10-CM | POA: Diagnosis not present

## 2013-04-14 DIAGNOSIS — L259 Unspecified contact dermatitis, unspecified cause: Secondary | ICD-10-CM | POA: Diagnosis not present

## 2013-04-25 DIAGNOSIS — R109 Unspecified abdominal pain: Secondary | ICD-10-CM | POA: Diagnosis not present

## 2013-04-25 DIAGNOSIS — R1084 Generalized abdominal pain: Secondary | ICD-10-CM | POA: Diagnosis not present

## 2013-04-27 DIAGNOSIS — I10 Essential (primary) hypertension: Secondary | ICD-10-CM | POA: Diagnosis not present

## 2013-04-27 DIAGNOSIS — K219 Gastro-esophageal reflux disease without esophagitis: Secondary | ICD-10-CM | POA: Diagnosis not present

## 2013-04-27 DIAGNOSIS — R071 Chest pain on breathing: Secondary | ICD-10-CM | POA: Diagnosis not present

## 2013-04-27 DIAGNOSIS — J449 Chronic obstructive pulmonary disease, unspecified: Secondary | ICD-10-CM | POA: Diagnosis not present

## 2013-04-27 DIAGNOSIS — B029 Zoster without complications: Secondary | ICD-10-CM | POA: Diagnosis not present

## 2013-05-01 ENCOUNTER — Ambulatory Visit (INDEPENDENT_AMBULATORY_CARE_PROVIDER_SITE_OTHER): Payer: Medicare Other | Admitting: Internal Medicine

## 2013-05-01 ENCOUNTER — Encounter: Payer: Self-pay | Admitting: Internal Medicine

## 2013-05-01 VITALS — BP 134/82 | HR 69 | Temp 97.9°F | Ht 71.0 in | Wt 182.1 lb

## 2013-05-01 DIAGNOSIS — I10 Essential (primary) hypertension: Secondary | ICD-10-CM

## 2013-05-01 DIAGNOSIS — M545 Low back pain: Secondary | ICD-10-CM

## 2013-05-01 DIAGNOSIS — G8929 Other chronic pain: Secondary | ICD-10-CM | POA: Diagnosis not present

## 2013-05-01 DIAGNOSIS — B029 Zoster without complications: Secondary | ICD-10-CM | POA: Diagnosis not present

## 2013-05-01 NOTE — Progress Notes (Addendum)
Subjective:    Patient ID: Calvin Pugh, male    DOB: 1936/05/20, 77 y.o.   MRN: [id number]  HPI  Here with wife, seen at [facility] with new onset left sided LBP pain and rash, tx with acylcovir, oxycodone after pain shot while there.  Fortunately pain has essentially resolved, despite very extensive rash, now crusted over.    Pt denies chest pain, increased sob or doe, wheezing, orthopnea, PND, increased LE swelling, palpitations, dizziness or syncope.   Pt denies polydipsia, polyuria.  Pt denies new neurological symptoms such as new headache, or facial or extremity weakness or numbness. Pt continues to have recurring LBP without change in severity, bowel or bladder change, fever, wt loss,  worsening LE pain/numbness/weakness, gait change or falls., and no current pain Past Medical History  Diagnosis Date  . ALLERGIC RHINITIS 12/08/2007  . ARTHRITIS 07/25/2007  . BENIGN PROSTATIC HYPERTROPHY, HX OF 07/25/2007  . CATARACTS, BILATERAL, HX OF 07/25/2007  . COPD 09/09/2009  . CORONARY ARTERY DISEASE 07/25/2007  . DEGENERATIVE JOINT DISEASE, CERVICAL SPINE 07/25/2007  . DIVERTICULOSIS, COLON, HX OF 07/25/2007  . DIVERTICULOSIS, COLON 12/08/2007  . DIZZINESS 09/08/2009  . FATIGUE 12/08/2007  . GERD 07/25/2007  . GLUCOSE INTOLERANCE 12/08/2007  . HYPERLIPIDEMIA 07/25/2007  . HYPERTENSION 07/25/2007  . Other acquired absence of organ 07/25/2007  . Other postprocedural status(V45.89) 07/25/2007  . PERIPHERAL VASCULAR DISEASE 12/08/2007  . Solitary pulmonary nodule 09/29/2010  . Wheezing 09/29/2010  . Lower back pain 12/17/2011   Past Surgical History  Procedure Laterality Date  . Rotator cuff repair right    . Hemorrhoid surgery    . Appendectomy    . Tonsillectomy    . Carpal tunnel release    . Cataract extraction      reports that he has quit smoking. He does not have any smokeless tobacco history on file. He reports that  drinks alcohol. He reports that he does not use illicit drugs. family history  includes COPD in his father; Cancer in his father; Heart disease in his mother; and Hypertension in his sister. Allergies  Allergen Reactions  . Codeine     REACTION: causes nausea  . Isosorbide Mononitrate     REACTION: severe h/a   Current Outpatient Prescriptions on File Prior to Visit  Medication Sig Dispense Refill  . aspirin 81 MG tablet Take 1 tablet (81 mg total) by mouth daily.  30 tablet  11  . cyclobenzaprine (FLEXERIL) 5 MG tablet Take 1 tablet (5 mg total) by mouth 3 (three) times daily as needed for muscle spasms.  60 tablet  5  . esomeprazole (NEXIUM) 40 MG capsule Take 1 capsule (40 mg total) by mouth daily.  90 capsule  3  . meclizine (ANTIVERT) 12.5 MG tablet Take 1 tablet (12.5 mg total) by mouth 3 (three) times daily as needed.  30 tablet  5  . mometasone-formoterol (DULERA) 100-5 MCG/ACT AERO Inhale 2 puffs into the lungs 2 (two) times daily.  8.8 g  11  . ramipril (ALTACE) 10 MG capsule TAKE 1 CAPSULE BY MOUTH TWICE DAILY  180 capsule  2  . simvastatin (ZOCOR) 40 MG tablet Take 1 tablet (40 mg total) by mouth daily.  90 tablet  3  . tiotropium (SPIRIVA HANDIHALER) 18 MCG inhalation capsule Place 1 capsule (18 mcg total) into inhaler and inhale daily.  90 capsule  3  . triamcinolone (NASACORT AQ) 55 MCG/ACT nasal inhaler Place 2 sprays into the nose daily.  3 Inhaler  3   No current facility-administered medications on file prior to visit.   Review of Systems  Constitutional: Negative for unexpected weight change, or unusual diaphoresis  HENT: Negative for tinnitus.   Eyes: Negative for photophobia and visual disturbance.  Respiratory: Negative for choking and stridor.   Gastrointestinal: Negative for vomiting and blood in stool.  Genitourinary: Negative for hematuria and decreased urine volume.  Musculoskeletal: Negative for acute joint swelling Skin: Negative for color change and wound.  Neurological: Negative for tremors and numbness other than noted   Psychiatric/Behavioral: Negative for decreased concentration or  hyperactivity.       Objective:   Physical Exam BP 134/82  Pulse 69  Temp(Src) 97.9 F (36.6 C) (Oral)  Ht 5\' 11"  (1.803 m)  Wt 182 lb 2 oz (82.611 kg)  BMI 25.41 kg/m2  SpO2 95% VS noted, not ill appaering Constitutional: Pt appears well-developed and well-nourished.  HENT: Head: NCAT.  Right Ear: External ear normal.  Left Ear: External ear normal.  Eyes: Conjunctivae and EOM are normal. Pupils are equal, round, and reactive to light.  Neck: Normal range of motion. Neck supple.  Cardiovascular: Normal rate and regular rhythm.   Pulmonary/Chest: Effort normal and breath sounds normal.  Abd:  Soft, NT, non-distended, + BS Neurological: Pt is alert. Not confused  Skin: crusted nontender extensive plaquelike erythema left dermatomal approx t12 Psychiatric: Pt behavior is normal. Thought content normal.     Assessment & Plan:  Quality Measures addressed:  Pneumonia Vaccine: pt declines, may self-refer to local pharmacy

## 2013-05-01 NOTE — Patient Instructions (Signed)
OK to finish your current acyclovir You can stop the lyrica and oxycodone Please continue all other medications as before, and refills have been done if requested. The rash should be healed in 1-2 more weeks  Please remember to sign up for My Chart if you have not done so, as this will be important to you in the future with finding out test results, communicating by private email, and scheduling acute appointments online when needed.

## 2013-05-02 DIAGNOSIS — B029 Zoster without complications: Secondary | ICD-10-CM | POA: Insufficient documentation

## 2013-05-02 NOTE — Assessment & Plan Note (Signed)
stable overall by history and exam, recent data reviewed with pt, and pt to continue medical treatment as before,  to f/u any worsening symptoms or concerns BP Readings from Last 3 Encounters:  05/01/13 134/82  12/17/12 110/72  06/09/12 122/80

## 2013-05-02 NOTE — Assessment & Plan Note (Signed)
Improving, to cont meds as is,  to f/u any worsening symptoms or concerns

## 2013-05-02 NOTE — Assessment & Plan Note (Signed)
stable overall by history and exam, recent data reviewed with pt, and pt to continue medical treatment as before,  to f/u any worsening symptoms or concerns Lab Results  Component Value Date   WBC 6.6 12/17/2012   HGB 16.2 12/17/2012   HCT 48.0 12/17/2012   PLT 229.0 12/17/2012   GLUCOSE 113* 12/17/2012   CHOL 148 12/17/2012   TRIG 74.0 12/17/2012   HDL 45.30 12/17/2012   LDLCALC 88 12/17/2012   ALT 29 12/17/2012   AST 31 12/17/2012   NA 137 12/17/2012   K 4.8 12/17/2012   CL 103 12/17/2012   CREATININE 1.2 12/17/2012   BUN 18 12/17/2012   CO2 28 12/17/2012   TSH 2.86 12/17/2012   PSA 0.43 12/17/2012   HGBA1C 5.8 12/17/2012

## 2013-05-18 ENCOUNTER — Telehealth: Payer: Self-pay | Admitting: *Deleted

## 2013-05-18 MED ORDER — TRAMADOL HCL 50 MG PO TABS: 50.0000 mg | ORAL_TABLET | Freq: Four times a day (QID) | ORAL | Status: DC | PRN

## 2013-05-18 NOTE — Telephone Encounter (Signed)
Faxed hardcopy to [employer] and informed the patients wife

## 2013-05-18 NOTE — Telephone Encounter (Signed)
Pt called states he was diagnosed with Shingles on 8.1.14,  Pt is requesting pain medication.  Please advise

## 2013-05-18 NOTE — Telephone Encounter (Signed)
Ok for tramadol prn; please make OV if this does not work out Secondary school teacher to [employer]

## 2013-06-08 DIAGNOSIS — B029 Zoster without complications: Secondary | ICD-10-CM | POA: Diagnosis not present

## 2013-06-16 DIAGNOSIS — B029 Zoster without complications: Secondary | ICD-10-CM | POA: Diagnosis not present

## 2013-06-23 ENCOUNTER — Ambulatory Visit: Payer: Medicare Other | Admitting: Internal Medicine

## 2013-06-25 DIAGNOSIS — B029 Zoster without complications: Secondary | ICD-10-CM | POA: Diagnosis not present

## 2013-07-02 DIAGNOSIS — B029 Zoster without complications: Secondary | ICD-10-CM | POA: Diagnosis not present

## 2013-07-14 DIAGNOSIS — J029 Acute pharyngitis, unspecified: Secondary | ICD-10-CM | POA: Diagnosis not present

## 2013-07-14 DIAGNOSIS — Z23 Encounter for immunization: Secondary | ICD-10-CM | POA: Diagnosis not present

## 2013-07-16 DIAGNOSIS — B029 Zoster without complications: Secondary | ICD-10-CM | POA: Diagnosis not present

## 2013-08-06 DIAGNOSIS — B029 Zoster without complications: Secondary | ICD-10-CM | POA: Diagnosis not present

## 2013-08-22 ENCOUNTER — Other Ambulatory Visit: Payer: Self-pay | Admitting: Internal Medicine

## 2013-09-10 DIAGNOSIS — B029 Zoster without complications: Secondary | ICD-10-CM | POA: Diagnosis not present

## 2013-10-01 HISTORY — PX: CHOLECYSTECTOMY: SHX55

## 2013-10-27 DIAGNOSIS — L821 Other seborrheic keratosis: Secondary | ICD-10-CM | POA: Diagnosis not present

## 2013-10-27 DIAGNOSIS — B029 Zoster without complications: Secondary | ICD-10-CM | POA: Diagnosis not present

## 2013-12-22 DIAGNOSIS — H02829 Cysts of unspecified eye, unspecified eyelid: Secondary | ICD-10-CM | POA: Diagnosis not present

## 2014-01-07 DIAGNOSIS — J42 Unspecified chronic bronchitis: Secondary | ICD-10-CM | POA: Diagnosis not present

## 2014-01-07 DIAGNOSIS — R079 Chest pain, unspecified: Secondary | ICD-10-CM | POA: Diagnosis not present

## 2014-01-07 DIAGNOSIS — J438 Other emphysema: Secondary | ICD-10-CM | POA: Diagnosis not present

## 2014-01-07 DIAGNOSIS — R1013 Epigastric pain: Secondary | ICD-10-CM | POA: Diagnosis not present

## 2014-01-07 DIAGNOSIS — R112 Nausea with vomiting, unspecified: Secondary | ICD-10-CM | POA: Diagnosis not present

## 2014-01-07 DIAGNOSIS — J8410 Pulmonary fibrosis, unspecified: Secondary | ICD-10-CM | POA: Diagnosis not present

## 2014-01-07 DIAGNOSIS — I10 Essential (primary) hypertension: Secondary | ICD-10-CM | POA: Diagnosis not present

## 2014-01-08 DIAGNOSIS — J4489 Other specified chronic obstructive pulmonary disease: Secondary | ICD-10-CM | POA: Diagnosis not present

## 2014-01-08 DIAGNOSIS — Z87891 Personal history of nicotine dependence: Secondary | ICD-10-CM | POA: Diagnosis not present

## 2014-01-08 DIAGNOSIS — J42 Unspecified chronic bronchitis: Secondary | ICD-10-CM | POA: Diagnosis not present

## 2014-01-08 DIAGNOSIS — R339 Retention of urine, unspecified: Secondary | ICD-10-CM | POA: Diagnosis not present

## 2014-01-08 DIAGNOSIS — I10 Essential (primary) hypertension: Secondary | ICD-10-CM | POA: Diagnosis not present

## 2014-01-08 DIAGNOSIS — R079 Chest pain, unspecified: Secondary | ICD-10-CM | POA: Diagnosis not present

## 2014-01-08 DIAGNOSIS — Z79899 Other long term (current) drug therapy: Secondary | ICD-10-CM | POA: Diagnosis not present

## 2014-01-08 DIAGNOSIS — K812 Acute cholecystitis with chronic cholecystitis: Secondary | ICD-10-CM | POA: Diagnosis not present

## 2014-01-08 DIAGNOSIS — J438 Other emphysema: Secondary | ICD-10-CM | POA: Diagnosis not present

## 2014-01-08 DIAGNOSIS — R1110 Vomiting, unspecified: Secondary | ICD-10-CM | POA: Diagnosis not present

## 2014-01-08 DIAGNOSIS — K819 Cholecystitis, unspecified: Secondary | ICD-10-CM | POA: Diagnosis not present

## 2014-01-08 DIAGNOSIS — J8410 Pulmonary fibrosis, unspecified: Secondary | ICD-10-CM | POA: Diagnosis not present

## 2014-01-08 DIAGNOSIS — G8918 Other acute postprocedural pain: Secondary | ICD-10-CM | POA: Diagnosis not present

## 2014-01-08 DIAGNOSIS — R112 Nausea with vomiting, unspecified: Secondary | ICD-10-CM | POA: Diagnosis not present

## 2014-01-08 DIAGNOSIS — R1013 Epigastric pain: Secondary | ICD-10-CM | POA: Diagnosis not present

## 2014-01-08 DIAGNOSIS — R1011 Right upper quadrant pain: Secondary | ICD-10-CM | POA: Diagnosis not present

## 2014-01-08 DIAGNOSIS — R0789 Other chest pain: Secondary | ICD-10-CM | POA: Diagnosis not present

## 2014-01-08 DIAGNOSIS — E7800 Pure hypercholesterolemia, unspecified: Secondary | ICD-10-CM | POA: Diagnosis present

## 2014-01-08 DIAGNOSIS — J449 Chronic obstructive pulmonary disease, unspecified: Secondary | ICD-10-CM | POA: Diagnosis not present

## 2014-01-08 DIAGNOSIS — R9431 Abnormal electrocardiogram [ECG] [EKG]: Secondary | ICD-10-CM | POA: Diagnosis not present

## 2014-01-08 DIAGNOSIS — K219 Gastro-esophageal reflux disease without esophagitis: Secondary | ICD-10-CM | POA: Diagnosis not present

## 2014-01-08 DIAGNOSIS — K810 Acute cholecystitis: Secondary | ICD-10-CM | POA: Diagnosis not present

## 2014-01-20 ENCOUNTER — Ambulatory Visit (INDEPENDENT_AMBULATORY_CARE_PROVIDER_SITE_OTHER): Payer: Medicare Other | Admitting: Internal Medicine

## 2014-01-20 ENCOUNTER — Telehealth: Payer: Self-pay | Admitting: Internal Medicine

## 2014-01-20 ENCOUNTER — Encounter: Payer: Self-pay | Admitting: Internal Medicine

## 2014-01-20 ENCOUNTER — Other Ambulatory Visit (INDEPENDENT_AMBULATORY_CARE_PROVIDER_SITE_OTHER): Payer: Medicare Other | Admitting: *Deleted

## 2014-01-20 VITALS — BP 130/80 | HR 90 | Temp 97.2°F | Resp 16 | Wt 174.2 lb

## 2014-01-20 DIAGNOSIS — R7302 Impaired glucose tolerance (oral): Secondary | ICD-10-CM

## 2014-01-20 DIAGNOSIS — R7309 Other abnormal glucose: Secondary | ICD-10-CM

## 2014-01-20 DIAGNOSIS — Z23 Encounter for immunization: Secondary | ICD-10-CM

## 2014-01-20 DIAGNOSIS — E785 Hyperlipidemia, unspecified: Secondary | ICD-10-CM

## 2014-01-20 DIAGNOSIS — I10 Essential (primary) hypertension: Secondary | ICD-10-CM

## 2014-01-20 MED ORDER — TRIAMCINOLONE ACETONIDE 55 MCG/ACT NA AERO: 2.0000 | INHALATION_SPRAY | Freq: Every day | NASAL | Status: DC

## 2014-01-20 MED ORDER — ESOMEPRAZOLE MAGNESIUM 40 MG PO CPDR: 40.0000 mg | DELAYED_RELEASE_CAPSULE | Freq: Every day | ORAL | Status: DC

## 2014-01-20 MED ORDER — ATORVASTATIN CALCIUM 20 MG PO TABS: 20.0000 mg | ORAL_TABLET | Freq: Every day | ORAL | Status: DC

## 2014-01-20 MED ORDER — MECLIZINE HCL 12.5 MG PO TABS: 12.5000 mg | ORAL_TABLET | Freq: Three times a day (TID) | ORAL | Status: DC | PRN

## 2014-01-20 MED ORDER — RAMIPRIL 10 MG PO CAPS: ORAL_CAPSULE | ORAL | Status: DC

## 2014-01-20 MED ORDER — CYCLOBENZAPRINE HCL 5 MG PO TABS: 5.0000 mg | ORAL_TABLET | Freq: Three times a day (TID) | ORAL | Status: DC | PRN

## 2014-01-20 NOTE — Progress Notes (Signed)
Pre visit review using our clinic review tool, if applicable. No additional management support is needed unless otherwise documented below in the visit note. 

## 2014-01-20 NOTE — Assessment & Plan Note (Signed)
stable overall by history and exam, recent data reviewed with pt, and pt to continue medical treatment as before,  to f/u any worsening symptoms or concerns Lab Results  Component Value Date   HGBA1C 5.8 12/17/2012

## 2014-01-20 NOTE — Assessment & Plan Note (Signed)
stable overall by history and exam, recent data reviewed with pt, and pt to continue medical treatment as before,  to f/u any worsening symptoms or concerns BP Readings from Last 3 Encounters:  01/20/14 130/80  05/01/13 134/82  12/17/12 110/72

## 2014-01-20 NOTE — Assessment & Plan Note (Signed)
stable overall by history and exam, recent data reviewed with pt, and pt to change zocor to lipitor 20 qd,for labs next visit,  to f/u any worsening symptoms or concerns

## 2014-01-20 NOTE — Telephone Encounter (Signed)
Relevant patient education assigned to patient using Emmi. ° °

## 2014-01-20 NOTE — Patient Instructions (Addendum)
OK to stop the zocor  Please take all new medication as prescribed - the generic lipitor 20 mg per day  You had the Prevnar pneumonia shot today  Please continue all other medications as before, and refills have been done if requested. Please have the pharmacy call with any other refills you may need.  Please continue your efforts at being more active, low cholesterol diet, and weight control.  You are otherwise up to date with prevention measures today.  We will hold on further blood testing since you just had everything but the lipids at your hospn last wk  Please keep your appointments with your specialists as you may have planned- urology  Please return in 6 months, or sooner if needed

## 2014-01-20 NOTE — Progress Notes (Signed)
Subjective:    Patient ID: Calvin Pugh, male    DOB: 05/29/1936, 78 y.o.   MRN: [id number]  HPI  Here for yearly f/u;  Overall doing ok; incidentally had CCX at [facility], had CT chest noted abnormal on documentation today but no details and pt says they did want him to see pulm there but no hint of nodule or possible malignancy.  Pt denies CP, worsening SOB, DOE, wheezing, orthopnea, PND, worsening LE edema, palpitations, dizziness or syncope.  Pt denies neurological change such as new headache, facial or extremity weakness.  Pt denies polydipsia, polyuria, or low sugar symptoms. Pt states overall good compliance with treatment and medications, good tolerability, and has been trying to follow lower cholesterol diet.  Pt denies worsening depressive symptoms, suicidal ideation or panic. No fever, night sweats, wt loss, loss of appetite, or other constitutional symptoms.  Pt states good ability with ADL's, has low fall risk, home safety reviewed and adequate, no other significant changes in hearing or vision, and only occasionally active with exercise. Not taking zocor due to myalgias recently now resolved.  Has appt with urology next Monday Past Medical History  Diagnosis Date  . ALLERGIC RHINITIS 12/08/2007  . ARTHRITIS 07/25/2007  . BENIGN PROSTATIC HYPERTROPHY, HX OF 07/25/2007  . CATARACTS, BILATERAL, HX OF 07/25/2007  . COPD 09/09/2009  . CORONARY ARTERY DISEASE 07/25/2007  . DEGENERATIVE JOINT DISEASE, CERVICAL SPINE 07/25/2007  . DIVERTICULOSIS, COLON, HX OF 07/25/2007  . DIVERTICULOSIS, COLON 12/08/2007  . DIZZINESS 09/08/2009  . FATIGUE 12/08/2007  . GERD 07/25/2007  . GLUCOSE INTOLERANCE 12/08/2007  . HYPERLIPIDEMIA 07/25/2007  . HYPERTENSION 07/25/2007  . Other acquired absence of organ 07/25/2007  . Other postprocedural status(V45.89) 07/25/2007  . PERIPHERAL VASCULAR DISEASE 12/08/2007  . Solitary pulmonary nodule 09/29/2010  . Wheezing 09/29/2010  . Lower back pain 12/17/2011    Past Surgical History  Procedure Laterality Date  . Rotator cuff repair right    . Hemorrhoid surgery    . Appendectomy    . Tonsillectomy    . Carpal tunnel release    . Cataract extraction    . Cholecystectomy  2015    [facility]    reports that he has quit smoking. He does not have any smokeless tobacco history on file. He reports that he drinks alcohol. He reports that he does not use illicit drugs. family history includes COPD in his father; Cancer in his father; Heart disease in his mother; Hypertension in his sister. Allergies  Allergen Reactions  . Codeine     REACTION: causes nausea  . Isosorbide Mononitrate     REACTION: severe h/a  . Zocor [Simvastatin]     myalgia   Review of Systems Constitutional: Negative for diaphoresis, activity change, appetite change or unexpected weight change.  HENT: Negative for hearing loss, ear pain, facial swelling, mouth sores and neck stiffness.   Eyes: Negative for pain, redness and visual disturbance.  Respiratory: Negative for shortness of breath and wheezing.   Cardiovascular: Negative for chest pain and palpitations.  Gastrointestinal: Negative for diarrhea, blood in stool, abdominal distention or other pain Genitourinary: Negative for hematuria, flank pain or change in urine volume.  Musculoskeletal: Negative for myalgias and joint swelling.  Skin: Negative for color change and wound.  Neurological: Negative for syncope and numbness. other than noted Hematological: Negative for adenopathy.  Psychiatric/Behavioral: Negative for hallucinations, self-injury, decreased concentration and agitation.      Objective:   Physical Exam BP 130/80  Pulse 90  Temp(Src) 97.2 F (36.2 C) (Oral)  Resp 16  Wt 174 lb 4 oz (79.039 kg)  SpO2 97% VS noted,  Constitutional: Pt is oriented to person, place, and time. Appears well-developed and well-nourished.  Head: Normocephalic and atraumatic.  Right Ear: External ear normal.   Left Ear: External ear normal.  Nose: Nose normal.  Mouth/Throat: Oropharynx is clear and moist.  Eyes: Conjunctivae and EOM are normal. Pupils are equal, round, and reactive to light.  Neck: Normal range of motion. Neck supple. No JVD present. No tracheal deviation present.  Cardiovascular: Normal rate, regular rhythm, normal heart sounds and intact distal pulses.   Pulmonary/Chest: Effort normal and breath sounds normal.  Abdominal: Soft. Bowel sounds are normal. There is no tenderness. No HSM , had 4 small incision sites intact wihtout cellulitis Musculoskeletal: Normal range of motion. Exhibits no edema.  Lymphadenopathy:  Has no cervical adenopathy.  Neurological: Pt is alert and oriented to person, place, and time. Pt has normal reflexes. No cranial nerve deficit.  Skin: Skin is warm and dry. No rash noted.  Psychiatric:  Has  normal mood and affect. Behavior is normal.     Assessment & Plan:

## 2014-01-25 DIAGNOSIS — N401 Enlarged prostate with lower urinary tract symptoms: Secondary | ICD-10-CM | POA: Diagnosis not present

## 2014-01-25 DIAGNOSIS — R351 Nocturia: Secondary | ICD-10-CM | POA: Diagnosis not present

## 2014-01-27 DIAGNOSIS — J8410 Pulmonary fibrosis, unspecified: Secondary | ICD-10-CM | POA: Diagnosis not present

## 2014-01-27 DIAGNOSIS — J449 Chronic obstructive pulmonary disease, unspecified: Secondary | ICD-10-CM | POA: Diagnosis not present

## 2014-01-28 DIAGNOSIS — J8410 Pulmonary fibrosis, unspecified: Secondary | ICD-10-CM | POA: Diagnosis not present

## 2014-02-02 IMAGING — CR DG CHEST 2V
2 series · 2 of 2 positions shown · non-contrast
Comparison: 09/30/2010

CLINICAL DATA: Recent worsening of shortness of breath.  Wheezing.
Cough.   Emphysema.

CHEST - 2 VIEW

[view not recorded (1 of 2)]
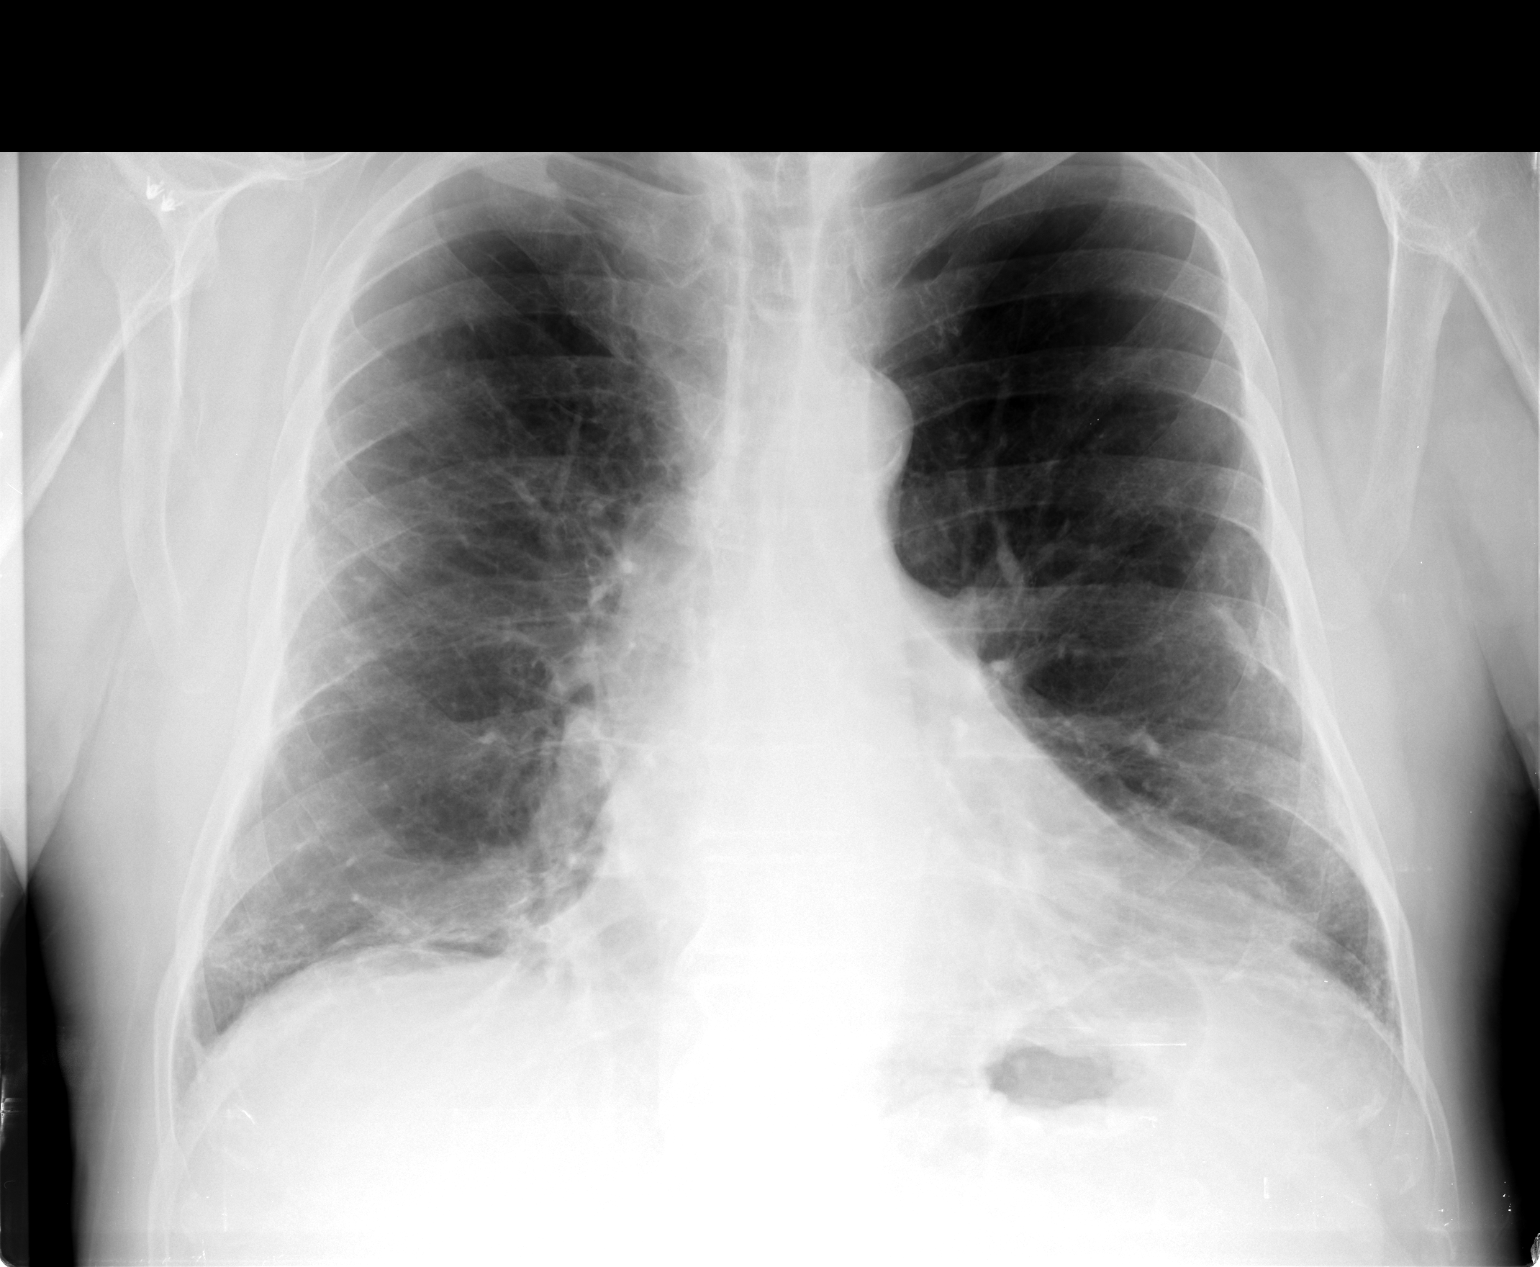

[view not recorded (2 of 2)]
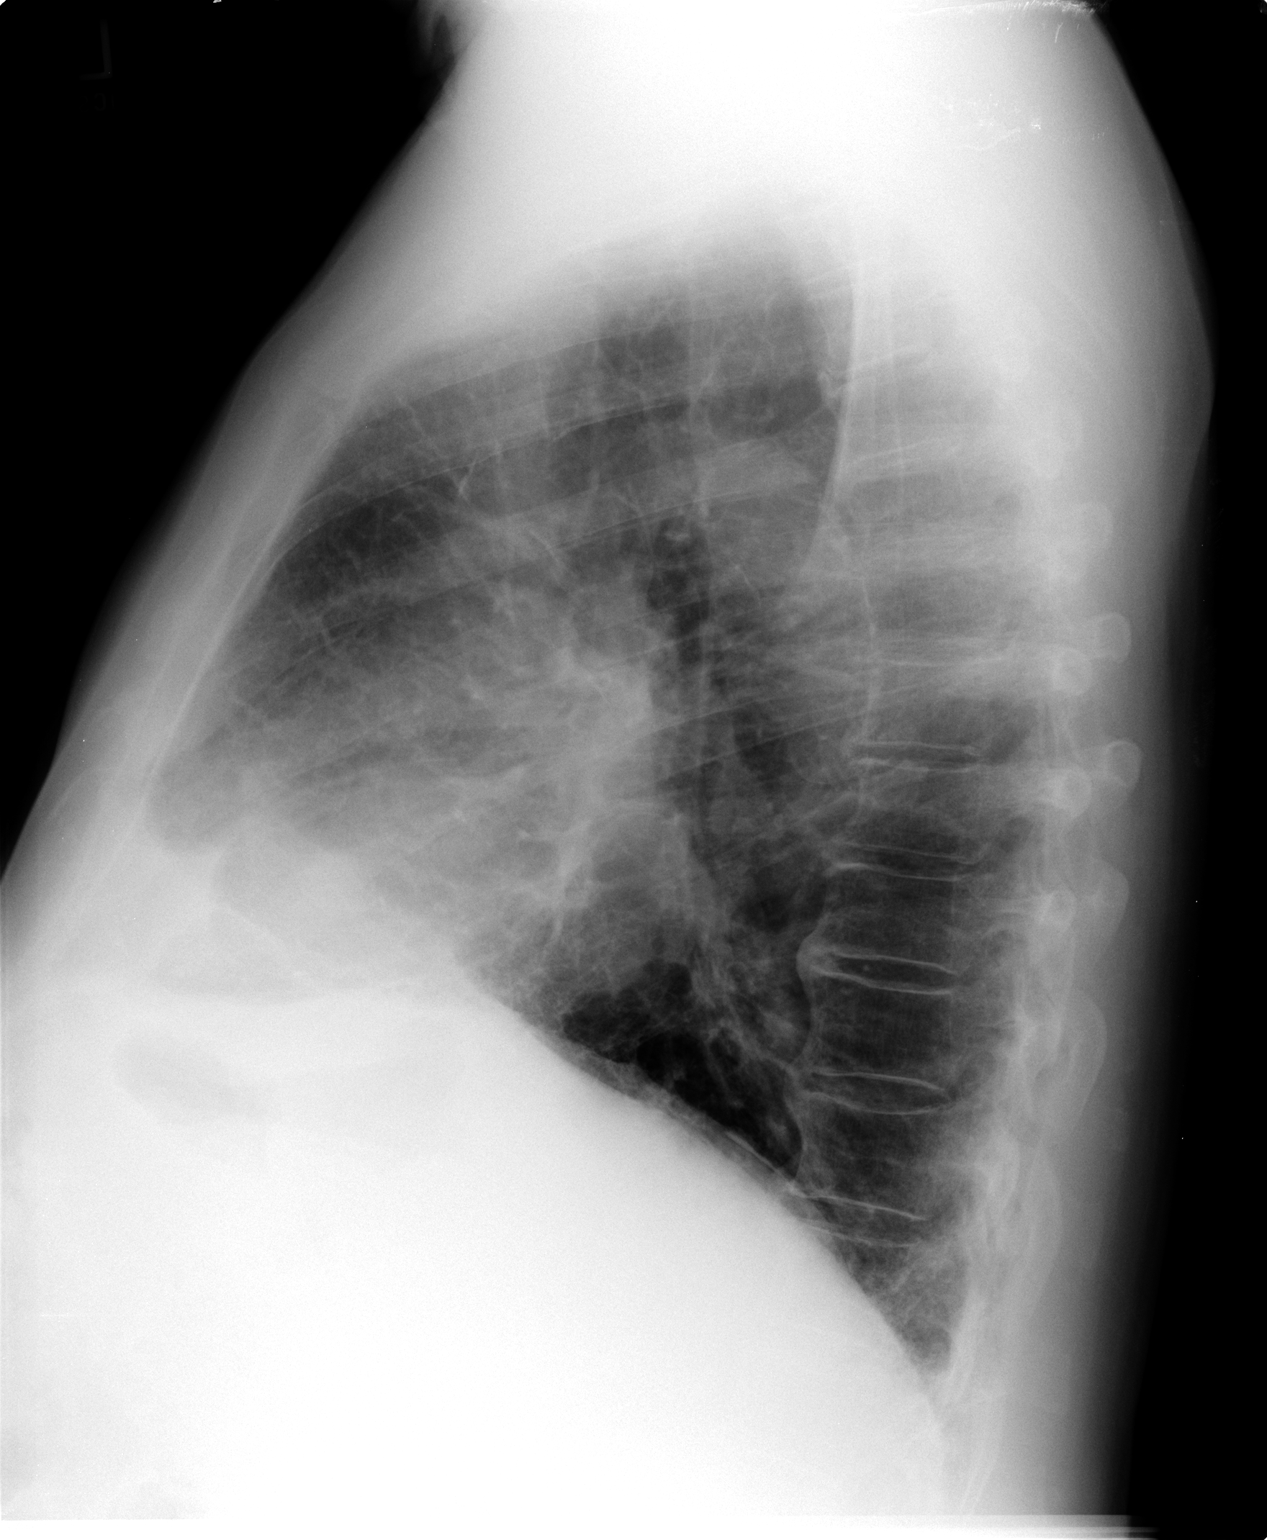

[2 of 2 positions shown; findings below may reference images not displayed]

FINDINGS: Heart size and pulmonary vascularity are normal.  There
are no acute infiltrates or effusions.  Chronic interstitial and
obstructive changes are noted bilaterally with  pleural
calcifications laterally on the left, stable.  No acute osseous
abnormality.
IMPRESSION: No acute abnormality.  Chronic interstitial and obstructive lung
disease.

## 2014-02-24 DIAGNOSIS — Z125 Encounter for screening for malignant neoplasm of prostate: Secondary | ICD-10-CM | POA: Diagnosis not present

## 2014-02-24 DIAGNOSIS — R351 Nocturia: Secondary | ICD-10-CM | POA: Diagnosis not present

## 2014-02-24 DIAGNOSIS — N401 Enlarged prostate with lower urinary tract symptoms: Secondary | ICD-10-CM | POA: Diagnosis not present

## 2014-03-03 DIAGNOSIS — J449 Chronic obstructive pulmonary disease, unspecified: Secondary | ICD-10-CM | POA: Diagnosis not present

## 2014-03-03 DIAGNOSIS — J8410 Pulmonary fibrosis, unspecified: Secondary | ICD-10-CM | POA: Diagnosis not present

## 2014-03-23 DIAGNOSIS — L820 Inflamed seborrheic keratosis: Secondary | ICD-10-CM | POA: Diagnosis not present

## 2014-05-20 DIAGNOSIS — L820 Inflamed seborrheic keratosis: Secondary | ICD-10-CM | POA: Diagnosis not present

## 2014-06-09 DIAGNOSIS — J449 Chronic obstructive pulmonary disease, unspecified: Secondary | ICD-10-CM | POA: Diagnosis not present

## 2014-06-09 DIAGNOSIS — J8410 Pulmonary fibrosis, unspecified: Secondary | ICD-10-CM | POA: Diagnosis not present

## 2014-06-15 DIAGNOSIS — L738 Other specified follicular disorders: Secondary | ICD-10-CM | POA: Diagnosis not present

## 2014-06-15 DIAGNOSIS — L259 Unspecified contact dermatitis, unspecified cause: Secondary | ICD-10-CM | POA: Diagnosis not present

## 2014-06-15 DIAGNOSIS — L981 Factitial dermatitis: Secondary | ICD-10-CM | POA: Diagnosis not present

## 2014-07-12 DIAGNOSIS — J449 Chronic obstructive pulmonary disease, unspecified: Secondary | ICD-10-CM | POA: Diagnosis not present

## 2014-07-12 DIAGNOSIS — J8410 Pulmonary fibrosis, unspecified: Secondary | ICD-10-CM | POA: Diagnosis not present

## 2014-07-20 ENCOUNTER — Ambulatory Visit (INDEPENDENT_AMBULATORY_CARE_PROVIDER_SITE_OTHER): Payer: Medicare Other | Admitting: Internal Medicine

## 2014-07-20 ENCOUNTER — Encounter: Payer: Self-pay | Admitting: Internal Medicine

## 2014-07-20 ENCOUNTER — Other Ambulatory Visit (INDEPENDENT_AMBULATORY_CARE_PROVIDER_SITE_OTHER): Payer: Medicare Other

## 2014-07-20 VITALS — BP 118/82 | HR 67 | Temp 98.2°F | Ht 71.0 in | Wt 177.2 lb

## 2014-07-20 DIAGNOSIS — I10 Essential (primary) hypertension: Secondary | ICD-10-CM | POA: Diagnosis not present

## 2014-07-20 DIAGNOSIS — J8410 Pulmonary fibrosis, unspecified: Secondary | ICD-10-CM | POA: Insufficient documentation

## 2014-07-20 DIAGNOSIS — R7302 Impaired glucose tolerance (oral): Secondary | ICD-10-CM | POA: Diagnosis not present

## 2014-07-20 DIAGNOSIS — N320 Bladder-neck obstruction: Secondary | ICD-10-CM

## 2014-07-20 DIAGNOSIS — Z23 Encounter for immunization: Secondary | ICD-10-CM

## 2014-07-20 DIAGNOSIS — J9611 Chronic respiratory failure with hypoxia: Secondary | ICD-10-CM | POA: Diagnosis not present

## 2014-07-20 DIAGNOSIS — J438 Other emphysema: Secondary | ICD-10-CM

## 2014-07-20 DIAGNOSIS — E785 Hyperlipidemia, unspecified: Secondary | ICD-10-CM

## 2014-07-20 DIAGNOSIS — J9610 Chronic respiratory failure, unspecified whether with hypoxia or hypercapnia: Secondary | ICD-10-CM | POA: Insufficient documentation

## 2014-07-20 HISTORY — DX: Pulmonary fibrosis, unspecified: J84.10

## 2014-07-20 LAB — URINALYSIS, ROUTINE W REFLEX MICROSCOPIC
BILIRUBIN URINE: NEGATIVE
Hgb urine dipstick: NEGATIVE
KETONES UR: NEGATIVE
Leukocytes, UA: NEGATIVE
Nitrite: NEGATIVE
PH: 7 (ref 5.0–8.0)
RBC / HPF: NONE SEEN (ref 0–?)
Specific Gravity, Urine: 1.015 (ref 1.000–1.030)
Total Protein, Urine: NEGATIVE
UROBILINOGEN UA: 0.2 (ref 0.0–1.0)
Urine Glucose: NEGATIVE

## 2014-07-20 LAB — BASIC METABOLIC PANEL
BUN: 17 mg/dL (ref 6–23)
CALCIUM: 9.7 mg/dL (ref 8.4–10.5)
CO2: 26 mEq/L (ref 19–32)
CREATININE: 1.1 mg/dL (ref 0.4–1.5)
Chloride: 98 mEq/L (ref 96–112)
GFR: 69.18 mL/min (ref >60.00)
Glucose, Bld: 119 mg/dL — ABNORMAL HIGH (ref 70–99)
Potassium: 5.5 mEq/L — ABNORMAL HIGH (ref 3.5–5.1)
SODIUM: 135 meq/L (ref 135–145)

## 2014-07-20 LAB — LIPID PANEL
Cholesterol: 148 mg/dL (ref 0–200)
HDL: 67.2 mg/dL (ref >39.00)
LDL Cholesterol: 71 mg/dL (ref 0–99)
NONHDL: 80.8
Total CHOL/HDL Ratio: 2
Triglycerides: 51 mg/dL (ref 0.0–149.0)
VLDL: 10.2 mg/dL (ref 0.0–40.0)

## 2014-07-20 LAB — CBC WITH DIFFERENTIAL/PLATELET
Basophils Absolute: 0 10*3/uL (ref 0.0–0.1)
Basophils Relative: 0.4 % (ref 0.0–3.0)
EOS ABS: 0.2 10*3/uL (ref 0.0–0.7)
Eosinophils Relative: 2.9 % (ref 0.0–5.0)
HEMATOCRIT: 52.1 % — AB (ref 39.0–52.0)
Hemoglobin: 17.2 g/dL — ABNORMAL HIGH (ref 13.0–17.0)
LYMPHS ABS: 1.3 10*3/uL (ref 0.7–4.0)
Lymphocytes Relative: 15.9 % (ref 12.0–46.0)
MCHC: 33 g/dL (ref 30.0–36.0)
MCV: 92.3 fl (ref 78.0–100.0)
MONO ABS: 1 10*3/uL (ref 0.1–1.0)
Monocytes Relative: 12.2 % — ABNORMAL HIGH (ref 3.0–12.0)
Neutro Abs: 5.7 10*3/uL (ref 1.4–7.7)
Neutrophils Relative %: 68.6 % (ref 43.0–77.0)
Platelets: 217 10*3/uL (ref 150.0–400.0)
RBC: 5.65 Mil/uL (ref 4.22–5.81)
RDW: 15.4 % (ref 11.5–15.5)
WBC: 8.2 10*3/uL (ref 4.0–10.5)

## 2014-07-20 LAB — PSA: PSA: 0.42 ng/mL (ref 0.10–4.00)

## 2014-07-20 LAB — HEPATIC FUNCTION PANEL
ALK PHOS: 73 U/L (ref 39–117)
ALT: 28 U/L (ref 0–53)
AST: 26 U/L (ref 0–37)
Albumin: 3.5 g/dL (ref 3.5–5.2)
Bilirubin, Direct: 0.2 mg/dL (ref 0.0–0.3)
Total Bilirubin: 0.8 mg/dL (ref 0.2–1.2)
Total Protein: 7.6 g/dL (ref 6.0–8.3)

## 2014-07-20 LAB — TSH: TSH: 2.46 u[IU]/mL (ref 0.35–4.50)

## 2014-07-20 LAB — HEMOGLOBIN A1C: HEMOGLOBIN A1C: 6 % (ref 4.6–6.5)

## 2014-07-20 NOTE — Patient Instructions (Addendum)

## 2014-07-20 NOTE — Progress Notes (Signed)
Pre visit review using our clinic review tool, if applicable. No additional management support is needed unless otherwise documented below in the visit note. 

## 2014-07-20 NOTE — Assessment & Plan Note (Signed)
stable overall by history and exam, recent data reviewed with pt, and pt to continue medical treatment as before,  to f/u any worsening symptoms or concerns BP Readings from Last 3 Encounters:  07/20/14 118/82  01/20/14 130/80  05/01/13 134/82

## 2014-07-20 NOTE — Assessment & Plan Note (Signed)
Asympt, stable overall by history and exam, recent data reviewed with pt, and pt to continue medical treatment as before,  to f/u any worsening symptoms or concerns Lab Results  Component Value Date   HGBA1C 5.8 12/17/2012

## 2014-07-20 NOTE — Assessment & Plan Note (Signed)
stable overall by history and exam, recent data reviewed with pt, and pt to continue medical treatment as before,  to f/u any worsening symptoms or concerns / Lab Results  Component Value Date   LDLCALC 88 12/17/2012

## 2014-07-20 NOTE — Assessment & Plan Note (Signed)
stable overall by history and exam, recent data reviewed with pt, and pt to continue medical treatment as before,  to f/u any worsening symptoms or concerns SpO2 Readings from Last 3 Encounters:  07/20/14 93%  01/20/14 97%  05/01/13 95%

## 2014-07-20 NOTE — Progress Notes (Signed)
Subjective:    Patient ID: Calvin Pugh, male    DOB: 10/15/1935, 78 y.o.   MRN: [id number]  HPI    Here for yearly f/u;  Overall doing ok;  Pt denies CP, worsening SOB, DOE, wheezing, orthopnea, PND, worsening LE edema, palpitations, dizziness or syncope. Has been seeing Dr Warner Mccreedy in [place], now on Home O2 2L continous due to exertional desaturation.   Pt cant tell the difference he states, not really using continuously.  Pt denies neurological change such as new headache, facial or extremity weakness.  Pt denies polydipsia, polyuria, or low sugar symptoms. Pt states overall good compliance with treatment and medications, good tolerability, and has been trying to follow lower cholesterol diet.  Pt denies worsening depressive symptoms, suicidal ideation or panic. No fever, night sweats, wt loss, loss of appetite, or other constitutional symptoms.  Pt states good ability with ADL's, has low fall risk, home safety reviewed and adequate, no other significant changes in hearing or vision, and only occasionally active with exercise. - walk on treadmill 3-4 times per wk - 25-30 min at just over 1 MPH. Past Medical History  Diagnosis Date  . ALLERGIC RHINITIS 12/08/2007  . ARTHRITIS 07/25/2007  . BENIGN PROSTATIC HYPERTROPHY, HX OF 07/25/2007  . CATARACTS, BILATERAL, HX OF 07/25/2007  . COPD 09/09/2009  . CORONARY ARTERY DISEASE 07/25/2007  . DEGENERATIVE JOINT DISEASE, CERVICAL SPINE 07/25/2007  . DIVERTICULOSIS, COLON, HX OF 07/25/2007  . DIVERTICULOSIS, COLON 12/08/2007  . DIZZINESS 09/08/2009  . FATIGUE 12/08/2007  . GERD 07/25/2007  . GLUCOSE INTOLERANCE 12/08/2007  . HYPERLIPIDEMIA 07/25/2007  . HYPERTENSION 07/25/2007  . Other acquired absence of organ 07/25/2007  . Other postprocedural status(V45.89) 07/25/2007  . PERIPHERAL VASCULAR DISEASE 12/08/2007  . Solitary pulmonary nodule 09/29/2010  . Wheezing 09/29/2010  . Lower back pain 12/17/2011   Past Surgical History  Procedure  Laterality Date  . Rotator cuff repair right    . Hemorrhoid surgery    . Appendectomy    . Tonsillectomy    . Carpal tunnel release    . Cataract extraction    . Cholecystectomy  2015    [facility]    reports that he has quit smoking. He does not have any smokeless tobacco history on file. He reports that he drinks alcohol. He reports that he does not use illicit drugs. family history includes COPD in his father; Cancer in his father; Heart disease in his mother; Hypertension in his sister. Allergies  Allergen Reactions  . Codeine     REACTION: causes nausea  . Isosorbide Mononitrate     REACTION: severe h/a  . Zocor [Simvastatin]     myalgia   Current Outpatient Prescriptions on File Prior to Visit  Medication Sig Dispense Refill  . aspirin 81 MG tablet Take 1 tablet (81 mg total) by mouth daily.  30 tablet  11  . atorvastatin (LIPITOR) 20 MG tablet Take 1 tablet (20 mg total) by mouth daily.  90 tablet  3  . Biotin 5000 MCG TABS Take by mouth.      . cyclobenzaprine (FLEXERIL) 5 MG tablet Take 1 tablet (5 mg total) by mouth 3 (three) times daily as needed for muscle spasms.  60 tablet  5  . esomeprazole (NEXIUM) 40 MG capsule Take 1 capsule (40 mg total) by mouth daily.  90 capsule  3  . meclizine (ANTIVERT) 12.5 MG tablet Take 1 tablet (12.5 mg total) by mouth 3 (three) times daily as needed.  30 tablet  5  . oxyCODONE (OXY IR/ROXICODONE) 5 MG immediate release tablet Take 5 mg by mouth every 4 (four) hours as needed for severe pain.      . ramipril (ALTACE) 10 MG capsule TAKE 1 CAPSULE TWICE A DAY  180 capsule  3  . triamcinolone (NASACORT AQ) 55 MCG/ACT AERO nasal inhaler Place 2 sprays into the nose daily.  3 Inhaler  3   No current facility-administered medications on file prior to visit.   Review of Systems  Constitutional: Negative for unusual diaphoresis or other sweats  HENT: Negative for ringing in ear Eyes: Negative for double vision or worsening visual  disturbance.  Respiratory: Negative for choking and stridor.   Gastrointestinal: Negative for vomiting or other signifcant bowel change Genitourinary: Negative for hematuria or decreased urine volume.  Musculoskeletal: Negative for other MSK pain or swelling Skin: Negative for color change and worsening wound.  Neurological: Negative for tremors and numbness other than noted  Psychiatric/Behavioral: Negative for decreased concentration or agitation other than above       Objective:   Physical Exam BP 118/82  Pulse 67  Temp(Src) 98.2 F (36.8 C) (Oral)  Ht 5\' 11"  (1.803 m)  Wt 177 lb 4 oz (80.4 kg)  BMI 24.73 kg/m2  SpO2 93% VS noted,  Constitutional: Pt appears well-developed, well-nourished.  HENT: Head: NCAT.  Right Ear: External ear normal.  Left Ear: External ear normal.  Eyes: . Pupils are equal, round, and reactive to light. Conjunctivae and EOM are normal Neck: Normal range of motion. Neck supple.  Cardiovascular: Normal rate and regular rhythm.   Pulmonary/Chest: Effort normal and breath sounds decreased bilat  Abd:  Soft, NT, ND, + BS Neurological: Pt is alert. Not confused , motor grossly intact Skin: Skin is warm. No rash Psychiatric: Pt behavior is normal. No agitation.     Assessment & Plan:

## 2014-07-20 NOTE — Assessment & Plan Note (Signed)
To cont home o2 continuouse 2L [place] - urged pt compliacne., f/u pulm

## 2014-08-16 DIAGNOSIS — J8410 Pulmonary fibrosis, unspecified: Secondary | ICD-10-CM | POA: Diagnosis not present

## 2014-08-16 DIAGNOSIS — J449 Chronic obstructive pulmonary disease, unspecified: Secondary | ICD-10-CM | POA: Diagnosis not present

## 2014-09-28 DIAGNOSIS — L570 Actinic keratosis: Secondary | ICD-10-CM | POA: Diagnosis not present

## 2014-10-14 DIAGNOSIS — J984 Other disorders of lung: Secondary | ICD-10-CM | POA: Diagnosis not present

## 2014-10-14 DIAGNOSIS — R911 Solitary pulmonary nodule: Secondary | ICD-10-CM | POA: Diagnosis not present

## 2014-10-14 DIAGNOSIS — J8410 Pulmonary fibrosis, unspecified: Secondary | ICD-10-CM | POA: Diagnosis not present

## 2014-10-14 DIAGNOSIS — J432 Centrilobular emphysema: Secondary | ICD-10-CM | POA: Diagnosis not present

## 2014-10-20 DIAGNOSIS — J449 Chronic obstructive pulmonary disease, unspecified: Secondary | ICD-10-CM | POA: Diagnosis not present

## 2014-10-20 DIAGNOSIS — J8410 Pulmonary fibrosis, unspecified: Secondary | ICD-10-CM | POA: Diagnosis not present

## 2014-11-23 DIAGNOSIS — L820 Inflamed seborrheic keratosis: Secondary | ICD-10-CM | POA: Diagnosis not present

## 2015-01-14 ENCOUNTER — Other Ambulatory Visit (HOSPITAL_COMMUNITY): Payer: Self-pay | Admitting: Internal Medicine

## 2015-01-14 ENCOUNTER — Other Ambulatory Visit: Payer: Self-pay | Admitting: Internal Medicine

## 2015-01-14 DIAGNOSIS — I6529 Occlusion and stenosis of unspecified carotid artery: Secondary | ICD-10-CM

## 2015-01-14 DIAGNOSIS — I669 Occlusion and stenosis of unspecified cerebral artery: Secondary | ICD-10-CM

## 2015-01-19 ENCOUNTER — Ambulatory Visit (INDEPENDENT_AMBULATORY_CARE_PROVIDER_SITE_OTHER): Payer: Medicare Other | Admitting: Internal Medicine

## 2015-01-19 ENCOUNTER — Encounter: Payer: Self-pay | Admitting: Internal Medicine

## 2015-01-19 VITALS — BP 132/80 | HR 75 | Temp 98.8°F | Resp 18 | Ht 71.0 in | Wt 184.0 lb

## 2015-01-19 DIAGNOSIS — N529 Male erectile dysfunction, unspecified: Secondary | ICD-10-CM

## 2015-01-19 DIAGNOSIS — I10 Essential (primary) hypertension: Secondary | ICD-10-CM | POA: Diagnosis not present

## 2015-01-19 DIAGNOSIS — R7302 Impaired glucose tolerance (oral): Secondary | ICD-10-CM

## 2015-01-19 DIAGNOSIS — E785 Hyperlipidemia, unspecified: Secondary | ICD-10-CM | POA: Diagnosis not present

## 2015-01-19 DIAGNOSIS — I6529 Occlusion and stenosis of unspecified carotid artery: Secondary | ICD-10-CM | POA: Diagnosis not present

## 2015-01-19 DIAGNOSIS — D751 Secondary polycythemia: Secondary | ICD-10-CM | POA: Diagnosis not present

## 2015-01-19 MED ORDER — TRIAMCINOLONE ACETONIDE 55 MCG/ACT NA AERO: 2.0000 | INHALATION_SPRAY | Freq: Every day | NASAL | Status: DC

## 2015-01-19 MED ORDER — CYCLOBENZAPRINE HCL 5 MG PO TABS: 5.0000 mg | ORAL_TABLET | Freq: Three times a day (TID) | ORAL | Status: DC | PRN

## 2015-01-19 MED ORDER — SILDENAFIL CITRATE 100 MG PO TABS: 50.0000 mg | ORAL_TABLET | Freq: Every day | ORAL | Status: DC | PRN

## 2015-01-19 MED ORDER — ESOMEPRAZOLE MAGNESIUM 40 MG PO CPDR: 40.0000 mg | DELAYED_RELEASE_CAPSULE | Freq: Every day | ORAL | Status: DC

## 2015-01-19 MED ORDER — ATORVASTATIN CALCIUM 20 MG PO TABS: 20.0000 mg | ORAL_TABLET | Freq: Every day | ORAL | Status: DC

## 2015-01-19 MED ORDER — RAMIPRIL 10 MG PO CAPS: ORAL_CAPSULE | ORAL | Status: DC

## 2015-01-19 MED ORDER — MECLIZINE HCL 12.5 MG PO TABS: 12.5000 mg | ORAL_TABLET | Freq: Three times a day (TID) | ORAL | Status: DC | PRN

## 2015-01-19 NOTE — Progress Notes (Signed)
Subjective:    Patient ID: Calvin Pugh, male    DOB: Mar 26, 1936, 79 y.o.   MRN: [id number]  HPI  Here to f/u; overall doing ok,  Pt denies chest pain, increasing sob or doe, wheezing, orthopnea, PND, increased LE swelling, palpitations, dizziness or syncope.  Pt denies new neurological symptoms such as new headache, or facial or extremity weakness or numbness.  Pt denies polydipsia, polyuria, or low sugar episode.   Pt denies new neurological symptoms such as new headache, or facial or extremity weakness or numbness.   Pt states overall good compliance with meds, mostly trying to follow appropriate diet, with wt overall stable,  but little exercise however.    Sees Dr Chodri/pulm in [place].  Now on home o2 2L continuous, sometime he will turn it off, dx with pulm fibrosis. Also having worsening ED symptoms in the past yr  - asks for viagra.   Past Medical History  Diagnosis Date  . ALLERGIC RHINITIS 12/08/2007  . ARTHRITIS 07/25/2007  . BENIGN PROSTATIC HYPERTROPHY, HX OF 07/25/2007  . CATARACTS, BILATERAL, HX OF 07/25/2007  . COPD 09/09/2009  . CORONARY ARTERY DISEASE 07/25/2007  . DEGENERATIVE JOINT DISEASE, CERVICAL SPINE 07/25/2007  . DIVERTICULOSIS, COLON, HX OF 07/25/2007  . DIVERTICULOSIS, COLON 12/08/2007  . DIZZINESS 09/08/2009  . FATIGUE 12/08/2007  . GERD 07/25/2007  . GLUCOSE INTOLERANCE 12/08/2007  . HYPERLIPIDEMIA 07/25/2007  . HYPERTENSION 07/25/2007  . Other acquired absence of organ 07/25/2007  . Other postprocedural status(V45.89) 07/25/2007  . PERIPHERAL VASCULAR DISEASE 12/08/2007  . Solitary pulmonary nodule 09/29/2010  . Wheezing 09/29/2010  . Lower back pain 12/17/2011  . Pulmonary fibrosis 07/20/2014   Past Surgical History  Procedure Laterality Date  . Rotator cuff repair right    . Hemorrhoid surgery    . Appendectomy    . Tonsillectomy    . Carpal tunnel release    . Cataract extraction    . Cholecystectomy  2015    [facility]    reports that he  has quit smoking. He does not have any smokeless tobacco history on file. He reports that he drinks alcohol. He reports that he does not use illicit drugs. family history includes COPD in his father; Cancer in his father; Heart disease in his mother; Hypertension in his sister. Allergies  Allergen Reactions  . Codeine     REACTION: causes nausea  . Isosorbide Mononitrate     REACTION: severe h/a  . Zocor [Simvastatin]     myalgia  ' Current Outpatient Prescriptions on File Prior to Visit  Medication Sig Dispense Refill  . atorvastatin (LIPITOR) 20 MG tablet Take 1 tablet (20 mg total) by mouth daily. 90 tablet 3  . Biotin 5000 MCG TABS Take by mouth.    . cyclobenzaprine (FLEXERIL) 5 MG tablet Take 1 tablet (5 mg total) by mouth 3 (three) times daily as needed for muscle spasms. 60 tablet 5  . esomeprazole (NEXIUM) 40 MG capsule Take 1 capsule (40 mg total) by mouth daily. 90 capsule 3  . meclizine (ANTIVERT) 12.5 MG tablet Take 1 tablet (12.5 mg total) by mouth 3 (three) times daily as needed. 30 tablet 5  . ramipril (ALTACE) 10 MG capsule TAKE 1 CAPSULE TWICE A DAY 180 capsule 3  . triamcinolone (NASACORT AQ) 55 MCG/ACT AERO nasal inhaler Place 2 sprays into the nose daily. 3 Inhaler 3  . aspirin 81 MG tablet Take 1 tablet (81 mg total) by mouth daily. (Patient not taking: Reported  on 01/19/2015) 30 tablet 11  . oxyCODONE (OXY IR/ROXICODONE) 5 MG immediate release tablet Take 5 mg by mouth every 4 (four) hours as needed for severe pain.     No current facility-administered medications on file prior to visit.     Review of Systems  Constitutional: Negative for unusual diaphoresis or night sweats HENT: Negative for ringing in ear or discharge Eyes: Negative for double vision or worsening visual disturbance.  Respiratory: Negative for choking and stridor.   Gastrointestinal: Negative for vomiting or other signifcant bowel change Genitourinary: Negative for hematuria or change in urine  volume.  Musculoskeletal: Negative for other MSK pain or swelling Skin: Negative for color change and worsening wound.  Neurological: Negative for tremors and numbness other than noted  Psychiatric/Behavioral: Negative for decreased concentration or agitation other than above       Objective:   Physical Exam BP 132/80 mmHg  Pulse 75  Temp(Src) 98.8 F (37.1 C) (Oral)  Resp 18  Ht 5\' 11"  (1.803 m)  Wt 184 lb (83.462 kg)  BMI 25.67 kg/m2  SpO2 97% VS noted,  Constitutional: Pt appears in no significant distress HENT: Head: NCAT.  Right Ear: External ear normal.  Left Ear: External ear normal.  Eyes: . Pupils are equal, round, and reactive to light. Conjunctivae and EOM are normal Neck: Normal range of motion. Neck supple.  Cardiovascular: Normal rate and regular rhythm.   Pulmonary/Chest: Effort normal and breath sounds without rales or wheezing.  Abd:  Soft, NT, ND, + BS Neurological: Pt is alert. Not confused , motor grossly intact Skin: Skin is warm. No rash, no LE edema Psychiatric: Pt behavior is normal. No agitation.   Lab Results  Component Value Date   WBC 8.2 07/20/2014   HGB 17.2* 07/20/2014   HCT 52.1* 07/20/2014   PLT 217.0 07/20/2014   GLUCOSE 119* 07/20/2014   CHOL 148 07/20/2014   TRIG 51.0 07/20/2014   HDL 67.20 07/20/2014   LDLCALC 71 07/20/2014   ALT 28 07/20/2014   AST 26 07/20/2014   NA 135 07/20/2014   K 5.5* 07/20/2014   CL 98 07/20/2014   CREATININE 1.1 07/20/2014   BUN 17 07/20/2014   CO2 26 07/20/2014   TSH 2.46 07/20/2014   PSA 0.42 07/20/2014   HGBA1C 6.0 07/20/2014          Assessment & Plan:

## 2015-01-19 NOTE — Progress Notes (Signed)
Pre visit review using our clinic review tool, if applicable. No additional management support is needed unless otherwise documented below in the visit note. 

## 2015-01-19 NOTE — Assessment & Plan Note (Signed)
Mild, suspect related to not wearing home o2 all the time, encouraged compliance with Home o2

## 2015-01-19 NOTE — Assessment & Plan Note (Signed)
Ok for viagra prn,  to f/u any worsening symptoms or concerns  

## 2015-01-19 NOTE — Assessment & Plan Note (Signed)
stable overall by history and exam, recent data reviewed with pt, and pt to continue medical treatment as before,  to f/u any worsening symptoms or concerns Lab Results  Component Value Date   HGBA1C 6.0 07/20/2014

## 2015-01-19 NOTE — Patient Instructions (Signed)
Please continue all other medications as before, and refills have been done if requested.  Please have the pharmacy call with any other refills you may need.  Please continue your efforts at being more active, low cholesterol diet, and weight control.  You are otherwise up to date with prevention measures today.  Please keep your appointments with your specialists as you may have planned  Please return in 6 months, or sooner if needed 

## 2015-01-19 NOTE — Assessment & Plan Note (Signed)
stable overall by history and exam, recent data reviewed with pt, and pt to continue medical treatment as before,  to f/u any worsening symptoms or concerns Lab Results  Component Value Date   LDLCALC 71 07/20/2014

## 2015-01-19 NOTE — Assessment & Plan Note (Signed)
stable overall by history and exam, recent data reviewed with pt, and pt to continue medical treatment as before,  to f/u any worsening symptoms or concerns BP Readings from Last 3 Encounters:  01/19/15 132/80  07/20/14 118/82  01/20/14 130/80

## 2015-01-24 DIAGNOSIS — J449 Chronic obstructive pulmonary disease, unspecified: Secondary | ICD-10-CM | POA: Diagnosis not present

## 2015-01-24 DIAGNOSIS — J8410 Pulmonary fibrosis, unspecified: Secondary | ICD-10-CM | POA: Diagnosis not present

## 2015-01-24 DIAGNOSIS — R0600 Dyspnea, unspecified: Secondary | ICD-10-CM | POA: Diagnosis not present

## 2015-02-16 ENCOUNTER — Ambulatory Visit (HOSPITAL_COMMUNITY): Payer: Medicare Other | Attending: Cardiovascular Disease

## 2015-02-16 ENCOUNTER — Encounter (HOSPITAL_COMMUNITY): Payer: TRICARE For Life (TFL)

## 2015-02-16 DIAGNOSIS — E785 Hyperlipidemia, unspecified: Secondary | ICD-10-CM | POA: Insufficient documentation

## 2015-02-16 DIAGNOSIS — I6523 Occlusion and stenosis of bilateral carotid arteries: Secondary | ICD-10-CM | POA: Diagnosis not present

## 2015-02-16 DIAGNOSIS — I669 Occlusion and stenosis of unspecified cerebral artery: Secondary | ICD-10-CM | POA: Diagnosis not present

## 2015-02-16 DIAGNOSIS — I10 Essential (primary) hypertension: Secondary | ICD-10-CM | POA: Diagnosis not present

## 2015-02-16 DIAGNOSIS — J449 Chronic obstructive pulmonary disease, unspecified: Secondary | ICD-10-CM | POA: Diagnosis not present

## 2015-02-16 DIAGNOSIS — Z87891 Personal history of nicotine dependence: Secondary | ICD-10-CM | POA: Insufficient documentation

## 2015-02-16 DIAGNOSIS — I2510 Atherosclerotic heart disease of native coronary artery without angina pectoris: Secondary | ICD-10-CM | POA: Insufficient documentation

## 2015-03-14 DIAGNOSIS — R0600 Dyspnea, unspecified: Secondary | ICD-10-CM | POA: Diagnosis not present

## 2015-03-14 DIAGNOSIS — J8410 Pulmonary fibrosis, unspecified: Secondary | ICD-10-CM | POA: Diagnosis not present

## 2015-03-14 DIAGNOSIS — J449 Chronic obstructive pulmonary disease, unspecified: Secondary | ICD-10-CM | POA: Diagnosis not present

## 2015-03-17 DIAGNOSIS — L820 Inflamed seborrheic keratosis: Secondary | ICD-10-CM | POA: Diagnosis not present

## 2015-03-28 DIAGNOSIS — J449 Chronic obstructive pulmonary disease, unspecified: Secondary | ICD-10-CM | POA: Diagnosis not present

## 2015-04-11 DIAGNOSIS — H04301 Unspecified dacryocystitis of right lacrimal passage: Secondary | ICD-10-CM | POA: Diagnosis not present

## 2015-04-12 DIAGNOSIS — H04001 Unspecified dacryoadenitis, right lacrimal gland: Secondary | ICD-10-CM | POA: Diagnosis not present

## 2015-04-15 DIAGNOSIS — H04001 Unspecified dacryoadenitis, right lacrimal gland: Secondary | ICD-10-CM | POA: Diagnosis not present

## 2015-04-20 DIAGNOSIS — H04001 Unspecified dacryoadenitis, right lacrimal gland: Secondary | ICD-10-CM | POA: Diagnosis not present

## 2015-04-26 DIAGNOSIS — J449 Chronic obstructive pulmonary disease, unspecified: Secondary | ICD-10-CM | POA: Diagnosis not present

## 2015-04-26 DIAGNOSIS — J8410 Pulmonary fibrosis, unspecified: Secondary | ICD-10-CM | POA: Diagnosis not present

## 2015-05-11 DIAGNOSIS — H0015 Chalazion left lower eyelid: Secondary | ICD-10-CM | POA: Diagnosis not present

## 2015-06-15 DIAGNOSIS — H0289 Other specified disorders of eyelid: Secondary | ICD-10-CM | POA: Diagnosis not present

## 2015-07-22 ENCOUNTER — Ambulatory Visit: Payer: TRICARE For Life (TFL) | Admitting: Internal Medicine

## 2015-07-27 DIAGNOSIS — J8410 Pulmonary fibrosis, unspecified: Secondary | ICD-10-CM | POA: Diagnosis not present

## 2015-07-27 DIAGNOSIS — J449 Chronic obstructive pulmonary disease, unspecified: Secondary | ICD-10-CM | POA: Diagnosis not present

## 2015-07-28 ENCOUNTER — Encounter: Payer: Self-pay | Admitting: Internal Medicine

## 2015-07-28 ENCOUNTER — Other Ambulatory Visit (INDEPENDENT_AMBULATORY_CARE_PROVIDER_SITE_OTHER): Payer: Medicare Other

## 2015-07-28 ENCOUNTER — Ambulatory Visit (INDEPENDENT_AMBULATORY_CARE_PROVIDER_SITE_OTHER): Payer: Medicare Other | Admitting: Internal Medicine

## 2015-07-28 VITALS — BP 118/70 | HR 67 | Temp 98.2°F | Ht 71.0 in | Wt 175.0 lb

## 2015-07-28 DIAGNOSIS — R7302 Impaired glucose tolerance (oral): Secondary | ICD-10-CM | POA: Diagnosis not present

## 2015-07-28 DIAGNOSIS — N320 Bladder-neck obstruction: Secondary | ICD-10-CM | POA: Diagnosis not present

## 2015-07-28 DIAGNOSIS — I10 Essential (primary) hypertension: Secondary | ICD-10-CM | POA: Diagnosis not present

## 2015-07-28 DIAGNOSIS — E785 Hyperlipidemia, unspecified: Secondary | ICD-10-CM | POA: Diagnosis not present

## 2015-07-28 DIAGNOSIS — I499 Cardiac arrhythmia, unspecified: Secondary | ICD-10-CM

## 2015-07-28 DIAGNOSIS — I6529 Occlusion and stenosis of unspecified carotid artery: Secondary | ICD-10-CM | POA: Diagnosis not present

## 2015-07-28 LAB — PSA: PSA: 0.61 ng/mL (ref 0.10–4.00)

## 2015-07-28 LAB — CBC WITH DIFFERENTIAL/PLATELET
BASOS PCT: 0.4 % (ref 0.0–3.0)
Basophils Absolute: 0 10*3/uL (ref 0.0–0.1)
EOS ABS: 0.4 10*3/uL (ref 0.0–0.7)
Eosinophils Relative: 5.4 % — ABNORMAL HIGH (ref 0.0–5.0)
HCT: 49.5 % (ref 39.0–52.0)
HEMOGLOBIN: 16.4 g/dL (ref 13.0–17.0)
Lymphocytes Relative: 17.9 % (ref 12.0–46.0)
Lymphs Abs: 1.5 10*3/uL (ref 0.7–4.0)
MCHC: 33.1 g/dL (ref 30.0–36.0)
MCV: 95.9 fl (ref 78.0–100.0)
MONO ABS: 0.7 10*3/uL (ref 0.1–1.0)
Monocytes Relative: 8.8 % (ref 3.0–12.0)
NEUTROS ABS: 5.5 10*3/uL (ref 1.4–7.7)
Neutrophils Relative %: 67.5 % (ref 43.0–77.0)
PLATELETS: 212 10*3/uL (ref 150.0–400.0)
RBC: 5.16 Mil/uL (ref 4.22–5.81)
RDW: 14.5 % (ref 11.5–15.5)
WBC: 8.1 10*3/uL (ref 4.0–10.5)

## 2015-07-28 LAB — HEMOGLOBIN A1C: Hgb A1c MFr Bld: 5.7 % (ref 4.6–6.5)

## 2015-07-28 LAB — BASIC METABOLIC PANEL
BUN: 13 mg/dL (ref 6–23)
CHLORIDE: 102 meq/L (ref 96–112)
CO2: 30 mEq/L (ref 19–32)
Calcium: 9.4 mg/dL (ref 8.4–10.5)
Creatinine, Ser: 0.96 mg/dL (ref 0.40–1.50)
GFR: 80.73 mL/min (ref >60.00)
GLUCOSE: 109 mg/dL — AB (ref 70–99)
POTASSIUM: 4.7 meq/L (ref 3.5–5.1)
Sodium: 137 mEq/L (ref 135–145)

## 2015-07-28 LAB — LIPID PANEL
CHOLESTEROL: 132 mg/dL (ref 0–200)
HDL: 48 mg/dL (ref >39.00)
LDL Cholesterol: 68 mg/dL (ref 0–99)
NonHDL: 84.34
TRIGLYCERIDES: 81 mg/dL (ref 0.0–149.0)
Total CHOL/HDL Ratio: 3
VLDL: 16.2 mg/dL (ref 0.0–40.0)

## 2015-07-28 LAB — URINALYSIS, ROUTINE W REFLEX MICROSCOPIC
Bilirubin Urine: NEGATIVE
Hgb urine dipstick: NEGATIVE
KETONES UR: NEGATIVE
Leukocytes, UA: NEGATIVE
Nitrite: NEGATIVE
PH: 7 (ref 5.0–8.0)
RBC / HPF: NONE SEEN (ref 0–?)
TOTAL PROTEIN, URINE-UPE24: NEGATIVE
URINE GLUCOSE: NEGATIVE
Urobilinogen, UA: 0.2 (ref 0.0–1.0)
WBC, UA: NONE SEEN (ref 0–?)

## 2015-07-28 LAB — TSH: TSH: 2.65 u[IU]/mL (ref 0.35–4.50)

## 2015-07-28 LAB — HEPATIC FUNCTION PANEL
ALBUMIN: 3.9 g/dL (ref 3.5–5.2)
ALT: 18 U/L (ref 0–53)
AST: 20 U/L (ref 0–37)
Alkaline Phosphatase: 75 U/L (ref 39–117)
BILIRUBIN TOTAL: 0.7 mg/dL (ref 0.2–1.2)
Bilirubin, Direct: 0.2 mg/dL (ref 0.0–0.3)
Total Protein: 7.1 g/dL (ref 6.0–8.3)

## 2015-07-28 MED ORDER — SILDENAFIL CITRATE 100 MG PO TABS: 50.0000 mg | ORAL_TABLET | Freq: Every day | ORAL | Status: DC | PRN

## 2015-07-28 NOTE — Progress Notes (Signed)
Subjective:    Patient ID: Calvin Pugh, male    DOB: 1936-08-10, 79 y.o.   MRN: [id number]  HPI  Here for yearly f/u;  Overall doing ok;  Pt denies Chest pain, worsening SOB, DOE, wheezing, orthopnea, PND, worsening LE edema, palpitations, dizziness or syncope.  Pt denies neurological change such as new headache, facial or extremity weakness.  Pt denies polydipsia, polyuria, or low sugar symptoms. Pt states overall good compliance with treatment and medications, good tolerability, and has been trying to follow appropriate diet.  Pt denies worsening depressive symptoms, suicidal ideation or panic. No fever, night sweats, wt loss, loss of appetite, or other constitutional symptoms.  Pt states good ability with ADL's, has low fall risk, home safety reviewed and adequate, no other significant changes in hearing or vision, and only occasionally active with exercise. No current compalints.  Needs viagra refill Past Medical History  Diagnosis Date  . ALLERGIC RHINITIS 12/08/2007  . ARTHRITIS 07/25/2007  . BENIGN PROSTATIC HYPERTROPHY, HX OF 07/25/2007  . CATARACTS, BILATERAL, HX OF 07/25/2007  . COPD 09/09/2009  . CORONARY ARTERY DISEASE 07/25/2007  . DEGENERATIVE JOINT DISEASE, CERVICAL SPINE 07/25/2007  . DIVERTICULOSIS, COLON, HX OF 07/25/2007  . DIVERTICULOSIS, COLON 12/08/2007  . DIZZINESS 09/08/2009  . FATIGUE 12/08/2007  . GERD 07/25/2007  . GLUCOSE INTOLERANCE 12/08/2007  . HYPERLIPIDEMIA 07/25/2007  . HYPERTENSION 07/25/2007  . Other acquired absence of organ 07/25/2007  . Other postprocedural status(V45.89) 07/25/2007  . PERIPHERAL VASCULAR DISEASE 12/08/2007  . Solitary pulmonary nodule 09/29/2010  . Wheezing 09/29/2010  . Lower back pain 12/17/2011  . Pulmonary fibrosis ([place]) 07/20/2014   Past Surgical History  Procedure Laterality Date  . Rotator cuff repair right    . Hemorrhoid surgery    . Appendectomy    . Tonsillectomy    . Carpal tunnel release    . Cataract extraction    .  Cholecystectomy  2015     hospital    reports that he has quit smoking. He does not have any smokeless tobacco history on file. He reports that he drinks alcohol. He reports that he does not use illicit drugs. family history includes COPD in his father; Cancer in his father; Heart disease in his mother; Hypertension in his sister. Allergies  Allergen Reactions  . Codeine     REACTION: causes nausea  . Isosorbide Mononitrate     REACTION: severe h/a  . Zocor [Simvastatin]     myalgia   Current Outpatient Prescriptions on File Prior to Visit  Medication Sig Dispense Refill  . albuterol (PROVENTIL HFA;VENTOLIN HFA) 108 (90 BASE) MCG/ACT inhaler Inhale 1 puff into the lungs every 6 (six) hours as needed for wheezing or shortness of breath.    Marland Kitchen aspirin 81 MG tablet Take 1 tablet (81 mg total) by mouth daily. 30 tablet 11  . atorvastatin (LIPITOR) 20 MG tablet Take 1 tablet (20 mg total) by mouth daily. 90 tablet 3  . Biotin 5000 MCG TABS Take by mouth.    . cyclobenzaprine (FLEXERIL) 5 MG tablet Take 1 tablet (5 mg total) by mouth 3 (three) times daily as needed for muscle spasms. 60 tablet 5  . esomeprazole (NEXIUM) 40 MG capsule Take 1 capsule (40 mg total) by mouth daily. 90 capsule 3  . Fluticasone-Salmeterol (ADVAIR) 250-50 MCG/DOSE AEPB Inhale 1 puff into the lungs 2 (two) times daily.    . meclizine (ANTIVERT) 12.5 MG tablet Take 1 tablet (12.5 mg total) by mouth 3 (three)  times daily as needed. 30 tablet 5  . oxyCODONE (OXY IR/ROXICODONE) 5 MG immediate release tablet Take 5 mg by mouth every 4 (four) hours as needed for severe pain.    . ramipril (ALTACE) 10 MG capsule TAKE 1 CAPSULE TWICE A DAY 180 capsule 3  . sildenafil (VIAGRA) 100 MG tablet Take 0.5-1 tablets (50-100 mg total) by mouth daily as needed for erectile dysfunction. 5 tablet 11  . triamcinolone (NASACORT AQ) 55 MCG/ACT AERO nasal inhaler Place 2 sprays into the nose daily. 3 Inhaler 3   No current  facility-administered medications on file prior to visit.   Review of Systems Constitutional: Negative for increased diaphoresis, other activity, appetite or siginficant weight change other than noted HENT: Negative for worsening hearing loss, ear pain, facial swelling, mouth sores and neck stiffness.   Eyes: Negative for other worsening pain, redness or visual disturbance.  Respiratory: Negative for shortness of breath and wheezing  Cardiovascular: Negative for chest pain and palpitations.  Gastrointestinal: Negative for diarrhea, blood in stool, abdominal distention or other pain Genitourinary: Negative for hematuria, flank pain or change in urine volume.  Musculoskeletal: Negative for myalgias or other joint complaints.  Skin: Negative for color change and wound or drainage.  Neurological: Negative for syncope and numbness. other than noted Hematological: Negative for adenopathy. or other swelling Psychiatric/Behavioral: Negative for hallucinations, SI, self-injury, decreased concentration or other worsening agitation.      Objective:   Physical Exam BP 118/70 mmHg  Pulse 67  Temp(Src) 98.2 F (36.8 C) (Oral)  Ht 5\' 11"  (1.803 m)  Wt 175 lb (79.379 kg)  BMI 24.42 kg/m2  SpO2 97% VS noted,  Constitutional: Pt is oriented to person, place, and time. Appears well-developed and well-nourished, in no significant distress Head: Normocephalic and atraumatic.  Right Ear: External ear normal.  Left Ear: External ear normal.  Nose: Nose normal.  Mouth/Throat: Oropharynx is clear and moist.  Eyes: Conjunctivae and EOM are normal. Pupils are equal, round, and reactive to light.  Neck: Normal range of motion. Neck supple. No JVD present. No tracheal deviation present or significant neck LA or mass Cardiovascular: Normal rate, irregular rhythm vs ectopy, normal heart sounds and intact distal pulses.   Pulmonary/Chest: Effort normal and breath sounds without rales or wheezing  Abdominal:  Soft. Bowel sounds are normal. NT. No HSM  Musculoskeletal: Normal range of motion. Exhibits no edema.  Lymphadenopathy:  Has no cervical adenopathy.  Neurological: Pt is alert and oriented to person, place, and time. Pt has normal reflexes. No cranial nerve deficit. Motor grossly intact Skin: Skin is warm and dry. No rash noted.  Psychiatric:  Has normal mood and affect. Behavior is normal.   ECG reviewed as per emr    Assessment & Plan:

## 2015-07-28 NOTE — Patient Instructions (Signed)
Your EKG was OK today, there were only 'extra beats" seen (not serious)  Please continue all other medications as before, and refills have been done if requested.  Please have the pharmacy call with any other refills you may need.  Please continue your efforts at being more active, low cholesterol diet, and weight control.  You are otherwise up to date with prevention measures today.  Please keep your appointments with your specialists as you may have planned  Please go to the LAB in the Basement (turn left off the elevator) for the tests to be done today  You will be contacted by phone if any changes need to be made immediately.  Otherwise, you will receive a letter about your results with an explanation, but please check with MyChart first.  Please remember to sign up for MyChart if you have not done so, as this will be important to you in the future with finding out test results, communicating by private email, and scheduling acute appointments online when needed.  Please return in 1 year for your yearly visit, or sooner if needed

## 2015-07-28 NOTE — Assessment & Plan Note (Signed)
stable overall by history and exam, recent data reviewed with pt, and pt to continue medical treatment as before,  to f/u any worsening symptoms or concerns BP Readings from Last 3 Encounters:  07/28/15 118/70  01/19/15 132/80  07/20/14 118/82

## 2015-07-28 NOTE — Assessment & Plan Note (Signed)
stable overall by history and exam, recent data reviewed with pt, and pt to continue medical treatment as before,  to f/u any worsening symptoms or concerns Lab Results  Component Value Date   HGBA1C 6.0 07/20/2014    

## 2015-07-28 NOTE — Progress Notes (Signed)
Pre visit review using our clinic review tool, if applicable. No additional management support is needed unless otherwise documented below in the visit note. 

## 2015-07-28 NOTE — Assessment & Plan Note (Signed)
Also for psa as he is due,  to f/u any worsening symptoms or concerns, asympt

## 2015-07-28 NOTE — Assessment & Plan Note (Signed)
stable overall by history and exam, recent data reviewed with pt, and pt to continue medical treatment as before,  to f/u any worsening symptoms or concerns Lab Results  Component Value Date   LDLCALC 71 07/20/2014    

## 2015-07-28 NOTE — Assessment & Plan Note (Signed)
Asympt, ECG reviewed as per emr - PAC only, o/w SR , controlled rate, d/w pt, ok to follow,  to f/u any worsening symptoms or concerns

## 2015-07-29 ENCOUNTER — Encounter: Payer: Self-pay | Admitting: Internal Medicine

## 2015-08-04 DIAGNOSIS — L570 Actinic keratosis: Secondary | ICD-10-CM | POA: Diagnosis not present

## 2015-08-31 DIAGNOSIS — J8410 Pulmonary fibrosis, unspecified: Secondary | ICD-10-CM | POA: Diagnosis not present

## 2015-08-31 DIAGNOSIS — J449 Chronic obstructive pulmonary disease, unspecified: Secondary | ICD-10-CM | POA: Diagnosis not present

## 2015-09-29 ENCOUNTER — Encounter: Payer: Self-pay | Admitting: *Deleted

## 2015-10-12 DIAGNOSIS — J8410 Pulmonary fibrosis, unspecified: Secondary | ICD-10-CM | POA: Diagnosis not present

## 2015-10-12 DIAGNOSIS — J449 Chronic obstructive pulmonary disease, unspecified: Secondary | ICD-10-CM | POA: Diagnosis not present

## 2015-11-30 DIAGNOSIS — M7702 Medial epicondylitis, left elbow: Secondary | ICD-10-CM | POA: Diagnosis not present

## 2015-12-07 DIAGNOSIS — M6281 Muscle weakness (generalized): Secondary | ICD-10-CM | POA: Diagnosis not present

## 2015-12-07 DIAGNOSIS — M25522 Pain in left elbow: Secondary | ICD-10-CM | POA: Diagnosis not present

## 2015-12-14 DIAGNOSIS — M6281 Muscle weakness (generalized): Secondary | ICD-10-CM | POA: Diagnosis not present

## 2015-12-14 DIAGNOSIS — M25522 Pain in left elbow: Secondary | ICD-10-CM | POA: Diagnosis not present

## 2016-01-03 ENCOUNTER — Ambulatory Visit (INDEPENDENT_AMBULATORY_CARE_PROVIDER_SITE_OTHER): Payer: Medicare Other | Admitting: Internal Medicine

## 2016-01-03 ENCOUNTER — Encounter: Payer: Self-pay | Admitting: Internal Medicine

## 2016-01-03 VITALS — BP 126/76 | HR 70 | Temp 98.6°F | Resp 20 | Wt 176.0 lb

## 2016-01-03 DIAGNOSIS — I10 Essential (primary) hypertension: Secondary | ICD-10-CM

## 2016-01-03 DIAGNOSIS — R7302 Impaired glucose tolerance (oral): Secondary | ICD-10-CM | POA: Diagnosis not present

## 2016-01-03 DIAGNOSIS — H9191 Unspecified hearing loss, right ear: Secondary | ICD-10-CM

## 2016-01-03 DIAGNOSIS — E785 Hyperlipidemia, unspecified: Secondary | ICD-10-CM

## 2016-01-03 MED ORDER — ESOMEPRAZOLE MAGNESIUM 40 MG PO CPDR: 40.0000 mg | DELAYED_RELEASE_CAPSULE | Freq: Every day | ORAL | Status: DC

## 2016-01-03 MED ORDER — TRIAMCINOLONE ACETONIDE 55 MCG/ACT NA AERO
2.0000 | INHALATION_SPRAY | Freq: Every day | NASAL | Status: DC
Start: 2016-01-03 — End: 2016-07-04

## 2016-01-03 MED ORDER — RAMIPRIL 10 MG PO CAPS: ORAL_CAPSULE | ORAL | Status: DC

## 2016-01-03 MED ORDER — ATORVASTATIN CALCIUM 20 MG PO TABS: 20.0000 mg | ORAL_TABLET | Freq: Every day | ORAL | Status: DC

## 2016-01-03 NOTE — Assessment & Plan Note (Signed)
Improved s/p irrigation and wax impactions removal,  to f/u any worsening symptoms or concerns

## 2016-01-03 NOTE — Progress Notes (Signed)
Subjective:    Patient ID: Calvin Pugh, male    DOB: 26-Aug-1936, 80 y.o.   MRN: [id number]  HPI  Here for yearly f/u;  Overall doing ok;  Pt denies Chest pain, worsening SOB, DOE, wheezing, orthopnea, PND, worsening LE edema, palpitations, dizziness or syncope.  Pt denies neurological change such as new headache, facial or extremity weakness.  Pt denies polydipsia, polyuria, or low sugar symptoms. Pt states overall good compliance with treatment and medications, good tolerability, and has been trying to follow appropriate diet.  Pt denies worsening depressive symptoms, suicidal ideation or panic. No fever, night sweats, wt loss, loss of appetite, or other constitutional symptoms.  Pt states good ability with ADL's, has low fall risk, home safety reviewed and adequate, no other significant changes in hearing or vision, and only occasionally active with exercise. Does have much decreased hearing to right ear for the past wk, tried to irrigate himself at home and not successful.  No ear pain, vertigo, Past Medical History  Diagnosis Date  . ALLERGIC RHINITIS 12/08/2007  . ARTHRITIS 07/25/2007  . BENIGN PROSTATIC HYPERTROPHY, HX OF 07/25/2007  . CATARACTS, BILATERAL, HX OF 07/25/2007  . COPD 09/09/2009  . CORONARY ARTERY DISEASE 07/25/2007  . DEGENERATIVE JOINT DISEASE, CERVICAL SPINE 07/25/2007  . DIVERTICULOSIS, COLON, HX OF 07/25/2007  . DIVERTICULOSIS, COLON 12/08/2007  . DIZZINESS 09/08/2009  . FATIGUE 12/08/2007  . GERD 07/25/2007  . GLUCOSE INTOLERANCE 12/08/2007  . HYPERLIPIDEMIA 07/25/2007  . HYPERTENSION 07/25/2007  . Other acquired absence of organ 07/25/2007  . Other postprocedural status(V45.89) 07/25/2007  . PERIPHERAL VASCULAR DISEASE 12/08/2007  . Solitary pulmonary nodule 09/29/2010  . Wheezing 09/29/2010  . Lower back pain 12/17/2011  . Pulmonary fibrosis ([place]) 07/20/2014   Past Surgical History  Procedure Laterality Date  . Rotator cuff repair right    . Hemorrhoid surgery      . Appendectomy    . Tonsillectomy    . Carpal tunnel release    . Cataract extraction    . Cholecystectomy  2015    [facility]    reports that he has quit smoking. He does not have any smokeless tobacco history on file. He reports that he drinks alcohol. He reports that he does not use illicit drugs. family history includes COPD in his father; Cancer in his father; Heart disease in his mother; Hypertension in his sister. Allergies  Allergen Reactions  . Codeine     REACTION: causes nausea  . Isosorbide Mononitrate     REACTION: severe h/a  . Zocor [Simvastatin]     myalgia   Current Outpatient Prescriptions on File Prior to Visit  Medication Sig Dispense Refill  . albuterol (PROVENTIL HFA;VENTOLIN HFA) 108 (90 BASE) MCG/ACT inhaler Inhale 1 puff into the lungs every 6 (six) hours as needed for wheezing or shortness of breath.    Marland Kitchen aspirin 81 MG tablet Take 1 tablet (81 mg total) by mouth daily. 30 tablet 11  . atorvastatin (LIPITOR) 20 MG tablet Take 1 tablet (20 mg total) by mouth daily. 90 tablet 3  . Biotin 5000 MCG TABS Take by mouth.    . cyclobenzaprine (FLEXERIL) 5 MG tablet Take 1 tablet (5 mg total) by mouth 3 (three) times daily as needed for muscle spasms. 60 tablet 5  . esomeprazole (NEXIUM) 40 MG capsule Take 1 capsule (40 mg total) by mouth daily. 90 capsule 3  . Fluticasone-Salmeterol (ADVAIR) 250-50 MCG/DOSE AEPB Inhale 1 puff into the lungs 2 (two) times  daily.    . meclizine (ANTIVERT) 12.5 MG tablet Take 1 tablet (12.5 mg total) by mouth 3 (three) times daily as needed. 30 tablet 5  . oxyCODONE (OXY IR/ROXICODONE) 5 MG immediate release tablet Take 5 mg by mouth every 4 (four) hours as needed for severe pain.    . ramipril (ALTACE) 10 MG capsule TAKE 1 CAPSULE TWICE A DAY 180 capsule 3  . sildenafil (VIAGRA) 100 MG tablet Take 0.5-1 tablets (50-100 mg total) by mouth daily as needed for erectile dysfunction. 24 tablet 3  . triamcinolone (NASACORT AQ) 55  MCG/ACT AERO nasal inhaler Place 2 sprays into the nose daily. 3 Inhaler 3   No current facility-administered medications on file prior to visit.   Review of Systems  Constitutional: Negative for increased diaphoresis, or other activity, appetite or siginficant weight change other than noted HENT: Negative for worsening hearing loss, ear pain, facial swelling, mouth sores and neck stiffness.   Eyes: Negative for other worsening pain, redness or visual disturbance.  Respiratory: Negative for choking or stridor Cardiovascular: Negative for other chest pain and palpitations.  Gastrointestinal: Negative for worsening diarrhea, blood in stool, or abdominal distention Genitourinary: Negative for hematuria, flank pain or change in urine volume.  Musculoskeletal: Negative for myalgias or other joint complaints.  Skin: Negative for other color change and wound or drainage.  Neurological: Negative for syncope and numbness. other than noted Hematological: Negative for adenopathy. or other swelling Psychiatric/Behavioral: Negative for hallucinations, SI, self-injury, decreased concentration or other worsening agitation.      Objective:   Physical Exam BP 126/76 mmHg  Pulse 70  Temp(Src) 98.6 F (37 C) (Oral)  Resp 20  Wt 176 lb (79.833 kg)  SpO2 93% VS noted, not ill appearing Constitutional: Pt is oriented to person, place, and time. Appears well-developed and well-nourished, in no significant distress Head: Normocephalic and atraumatic  Eyes: Conjunctivae and EOM are normal. Pupils are equal, round, and reactive to light Right Ear: External ear normal.  Left Ear: External ear normal Nose: Nose normal. \Right canal wax impaction removed with irrigation Mouth/Throat: Oropharynx is clear and moist  Neck: Normal range of motion. Neck supple. No JVD present. No tracheal deviation present or significant neck LA or mass Cardiovascular: Normal rate, regular rhythm, normal heart sounds and intact  distal pulses.   Pulmonary/Chest: Effort normal and breath sounds without rales or wheezing  Abdominal: Soft. Bowel sounds are normal. NT. No HSM  Musculoskeletal: Normal range of motion. Exhibits no edema Lymphadenopathy: Has no cervical adenopathy.  Neurological: Pt is alert and oriented to person, place, and time. Pt has normal reflexes. No cranial nerve deficit. Motor grossly intact, hearing much improved after irrigation Skin: Skin is warm and dry. No rash noted or new ulcers Psychiatric:  Has normal mood and affect. Behavior is normal.         Assessment & Plan:

## 2016-01-03 NOTE — Assessment & Plan Note (Signed)
stable overall by history and exam, recent data reviewed with pt, and pt to continue medical treatment as before,  to f/u any worsening symptoms or concerns BP Readings from Last 3 Encounters:  01/03/16 126/76  07/28/15 118/70  01/19/15 132/80

## 2016-01-03 NOTE — Progress Notes (Signed)
Pre visit review using our clinic review tool, if applicable. No additional management support is needed unless otherwise documented below in the visit note. 

## 2016-01-03 NOTE — Assessment & Plan Note (Signed)
stable overall by history and exam, recent data reviewed with pt, and pt to continue medical treatment as before,  to f/u any worsening symptoms or concerns Lab Results  Component Value Date   HGBA1C 5.7 07/28/2015

## 2016-01-03 NOTE — Assessment & Plan Note (Signed)
stable overall by history and exam, recent data reviewed with pt, and pt to continue medical treatment as before,  to f/u any worsening symptoms or concerns Lab Results  Component Value Date   LDLCALC 68 07/28/2015

## 2016-01-03 NOTE — Patient Instructions (Signed)
Your right ear was irrigated of wax today  Please continue all other medications as before, and refills have been done if requested.  Please have the pharmacy call with any other refills you may need.  Please continue your efforts at being more active, low cholesterol diet, and weight control.  You are otherwise up to date with prevention measures today.  Please keep your appointments with your specialists as you may have planned  Please return in 6 months, or sooner if needed

## 2016-01-24 DIAGNOSIS — J449 Chronic obstructive pulmonary disease, unspecified: Secondary | ICD-10-CM | POA: Diagnosis not present

## 2016-01-24 DIAGNOSIS — J8410 Pulmonary fibrosis, unspecified: Secondary | ICD-10-CM | POA: Diagnosis not present

## 2016-01-24 DIAGNOSIS — R0600 Dyspnea, unspecified: Secondary | ICD-10-CM | POA: Diagnosis not present

## 2016-02-03 ENCOUNTER — Telehealth: Payer: Self-pay | Admitting: Internal Medicine

## 2016-02-03 MED ORDER — ATORVASTATIN CALCIUM 20 MG PO TABS: 20.0000 mg | ORAL_TABLET | Freq: Every day | ORAL | Status: DC

## 2016-02-03 NOTE — Telephone Encounter (Signed)
Called pt no answer [facility], also will send 2 week supply to [facility] until he received mail order...Calvin Pugh

## 2016-02-03 NOTE — Telephone Encounter (Signed)
Pt called stat he [facility] got [facility] atorvastatin (LIPITOR) 20 MG tablet last month. Please check, he is out of this med.

## 2016-02-16 DIAGNOSIS — L820 Inflamed seborrheic keratosis: Secondary | ICD-10-CM | POA: Diagnosis not present

## 2016-03-05 DIAGNOSIS — M7061 Trochanteric bursitis, right hip: Secondary | ICD-10-CM | POA: Diagnosis not present

## 2016-03-05 DIAGNOSIS — M461 Sacroiliitis, not elsewhere classified: Secondary | ICD-10-CM | POA: Diagnosis not present

## 2016-03-08 DIAGNOSIS — M7061 Trochanteric bursitis, right hip: Secondary | ICD-10-CM | POA: Diagnosis not present

## 2016-03-12 DIAGNOSIS — M7061 Trochanteric bursitis, right hip: Secondary | ICD-10-CM | POA: Diagnosis not present

## 2016-03-16 DIAGNOSIS — M7061 Trochanteric bursitis, right hip: Secondary | ICD-10-CM | POA: Diagnosis not present

## 2016-03-19 DIAGNOSIS — M7061 Trochanteric bursitis, right hip: Secondary | ICD-10-CM | POA: Diagnosis not present

## 2016-03-22 DIAGNOSIS — M7061 Trochanteric bursitis, right hip: Secondary | ICD-10-CM | POA: Diagnosis not present

## 2016-03-26 DIAGNOSIS — J84112 Idiopathic pulmonary fibrosis: Secondary | ICD-10-CM | POA: Diagnosis not present

## 2016-03-26 DIAGNOSIS — J449 Chronic obstructive pulmonary disease, unspecified: Secondary | ICD-10-CM | POA: Diagnosis not present

## 2016-04-11 ENCOUNTER — Telehealth: Payer: Self-pay | Admitting: Internal Medicine

## 2016-04-11 MED ORDER — PANTOPRAZOLE SODIUM 40 MG PO TBEC: 40.0000 mg | DELAYED_RELEASE_TABLET | Freq: Every day | ORAL | Status: DC

## 2016-04-11 NOTE — Telephone Encounter (Signed)
Patient aware.

## 2016-04-11 NOTE — Telephone Encounter (Signed)
Ok for corinne to let pt know  nexium changed to protonix 40 qd - [facility]

## 2016-04-16 DIAGNOSIS — M461 Sacroiliitis, not elsewhere classified: Secondary | ICD-10-CM | POA: Diagnosis not present

## 2016-04-16 DIAGNOSIS — M7061 Trochanteric bursitis, right hip: Secondary | ICD-10-CM | POA: Diagnosis not present

## 2016-04-25 NOTE — Progress Notes (Signed)
Error

## 2016-05-24 DIAGNOSIS — T783XXA Angioneurotic edema, initial encounter: Secondary | ICD-10-CM | POA: Diagnosis not present

## 2016-05-28 DIAGNOSIS — J449 Chronic obstructive pulmonary disease, unspecified: Secondary | ICD-10-CM | POA: Diagnosis not present

## 2016-05-28 DIAGNOSIS — J84112 Idiopathic pulmonary fibrosis: Secondary | ICD-10-CM | POA: Diagnosis not present

## 2016-05-31 DIAGNOSIS — L820 Inflamed seborrheic keratosis: Secondary | ICD-10-CM | POA: Diagnosis not present

## 2016-06-27 ENCOUNTER — Ambulatory Visit (INDEPENDENT_AMBULATORY_CARE_PROVIDER_SITE_OTHER): Payer: Medicare Other | Admitting: Allergy

## 2016-06-27 ENCOUNTER — Encounter: Payer: Self-pay | Admitting: Allergy

## 2016-06-27 VITALS — BP 170/78 | HR 76 | Temp 98.0°F | Resp 20 | Ht 68.7 in | Wt 176.8 lb

## 2016-06-27 DIAGNOSIS — T783XXA Angioneurotic edema, initial encounter: Secondary | ICD-10-CM | POA: Insufficient documentation

## 2016-06-27 DIAGNOSIS — T7840XA Allergy, unspecified, initial encounter: Secondary | ICD-10-CM

## 2016-06-27 DIAGNOSIS — Z91018 Allergy to other foods: Secondary | ICD-10-CM

## 2016-06-27 NOTE — Progress Notes (Signed)
New Patient Note  RE: Calvin Pugh MRN: [id number] DOB: 1936/08/15 Date of Office Visit: 06/27/2016  Referring provider: Biagio Borg, MD Primary care provider: Cathlean Cower, MD  Chief Complaint: concern for nut allergy  History of present illness: Calvin Pugh is a 80 y.o. male presenting today for consultation for possible nut allergy.   He was referred by urgent care to see an allergist after several recent reactions.  He went to an urgent care about a month ago after he reports having "allergic attacks".  He thinks the trigger has been nuts.   2-3 months ago he ate pecan pie one evening.  The next morning he woke up and his upper lip was swollen. The swelling improved on its own and completely resolved over 2-3 days.  He stopped eating pecan pie.   2 weeks after that he had butter pecan ice cream (not realizing he was avoiding pecan) and he developed lower lip swelling he believes about 4-5 hours after ingestion.  He went to UC where he reports he got several shots and prescriptions including Epipen.  Several weeks later he ate a piece of candy that had nuts in it (doesn't recall what kind) at night and again he had lip swelling the next day when he woke up.  He used his Epipen with and went back to UC.   He also took the other medications he was prescribed from the UC.    He denies any urticaria with the swelling.  He also denies any tongue swelling, difficulty breathing, nausea/vomiting, lightheadedness or syncope.  He denies any history of flushing, abdominal cramping or syncope.  He denies any stings, medication changes however he has been taking ramipril for years.  No recent travel.  He denies any family history of swelling episodes.  He does eat red meats and had continued to eat red meats since he has had lip swelling without any issues.  He does not recall having any red meat ingestion prior to the lip swelling episodes.   He follows with Dr. Alcide Clever for his COPD and lung  disease.  He denies any issues with nasal or ocular symptoms suggestive of allergic rhinitis at this time.   He has no previous history of food allergy.     Review of systems: Review of Systems  Constitutional: Negative for chills and fever.  HENT: Negative for congestion and sore throat.   Eyes: Negative for redness.  Respiratory: Negative for cough, shortness of breath and wheezing.   Cardiovascular: Negative for chest pain.  Gastrointestinal: Negative for nausea and vomiting.  Skin: Negative for itching and rash.  Neurological: Negative for headaches.    All other systems negative unless noted above in HPI  Past medical history: Past Medical History:  Diagnosis Date  . ALLERGIC RHINITIS 12/08/2007  . ARTHRITIS 07/25/2007  . BENIGN PROSTATIC HYPERTROPHY, HX OF 07/25/2007  . CATARACTS, BILATERAL, HX OF 07/25/2007  . COPD 09/09/2009  . CORONARY ARTERY DISEASE 07/25/2007  . DEGENERATIVE JOINT DISEASE, CERVICAL SPINE 07/25/2007  . DIVERTICULOSIS, COLON 12/08/2007  . DIVERTICULOSIS, COLON, HX OF 07/25/2007  . DIZZINESS 09/08/2009  . FATIGUE 12/08/2007  . GERD 07/25/2007  . GLUCOSE INTOLERANCE 12/08/2007  . HYPERLIPIDEMIA 07/25/2007  . HYPERTENSION 07/25/2007  . Lower back pain 12/17/2011  . Other acquired absence of organ 07/25/2007  . Other postprocedural status(V45.89) 07/25/2007  . PERIPHERAL VASCULAR DISEASE 12/08/2007  . Pulmonary fibrosis ([place]) 07/20/2014  . Solitary pulmonary nodule 09/29/2010  . Wheezing 09/29/2010  Past surgical history: Past Surgical History:  Procedure Laterality Date  . APPENDECTOMY    . CARPAL TUNNEL RELEASE    . CATARACT EXTRACTION    . CHOLECYSTECTOMY  2015   [facility]  . HEMORRHOID SURGERY    . rotator cuff repair right    . TONSILLECTOMY      Family history:  Family History  Problem Relation Age of Onset  . Heart disease Mother     CHF  . COPD Father   . Cancer Father     lung  . Hypertension Sister   . Allergic  rhinitis Son     Social history: He lives in a home without carpeting with central cooling and gas heating.  No pets in the home.  He is a retired Chief Financial Officer.  He not longer smokes.    Medication List:   Medication List       Accurate as of 06/27/16  3:35 PM. Always use your most recent med list.          albuterol 108 (90 Base) MCG/ACT inhaler Commonly known as:  PROVENTIL HFA;VENTOLIN HFA Inhale 1 puff into the lungs every 6 (six) hours as needed for wheezing or shortness of breath.   aspirin 81 MG tablet Take 1 tablet (81 mg total) by mouth daily.   atorvastatin 20 MG tablet Commonly known as:  LIPITOR Take 1 tablet (20 mg total) by mouth daily.   Biotin 5000 MCG Tabs Take by mouth.   cyclobenzaprine 5 MG tablet Commonly known as:  FLEXERIL Take 1 tablet (5 mg total) by mouth 3 (three) times daily as needed for muscle spasms.   EPIPEN 2-PAK 0.3 mg/0.3 mL Soaj injection Generic drug:  EPINEPHrine Inject 0.3 mg into the muscle once.   Fluticasone-Salmeterol 250-50 MCG/DOSE Aepb Commonly known as:  ADVAIR Inhale 1 puff into the lungs 2 (two) times daily.   meclizine 12.5 MG tablet Commonly known as:  ANTIVERT Take 1 tablet (12.5 mg total) by mouth 3 (three) times daily as needed.   oxyCODONE 5 MG immediate release tablet Commonly known as:  Oxy IR/ROXICODONE Take 5 mg by mouth every 4 (four) hours as needed for severe pain.   pantoprazole 40 MG tablet Commonly known as:  PROTONIX Take 1 tablet (40 mg total) by mouth daily.   ramipril 10 MG capsule Commonly known as:  ALTACE TAKE 1 CAPSULE TWICE A DAY   sildenafil 100 MG tablet Commonly known as:  VIAGRA Take 0.5-1 tablets (50-100 mg total) by mouth daily as needed for erectile dysfunction.   SPIRIVA RESPIMAT 2.5 MCG/ACT Aers Generic drug:  Tiotropium Bromide Monohydrate Inhale into the lungs.   triamcinolone 55 MCG/ACT Aero nasal inhaler Commonly known as:  NASACORT AQ Place 2 sprays into the nose  daily.       Known medication allergies: Allergies  Allergen Reactions  . Codeine     REACTION: causes nausea  . Isosorbide Mononitrate     REACTION: severe h/a     Physical examination: Blood pressure (!) 170/78, pulse 76, temperature 98 F (36.7 C), temperature source Oral, resp. rate 20, height 5' 8.7" (1.745 m), weight 176 lb 12.8 oz (80.2 kg).  General: Alert, interactive, in no acute distress. HEENT: TMs pearly gray, turbinates non-edematous without discharge, post-pharynx non erythematous. Neck: Supple without lymphadenopathy. Lungs: Clear to auscultation without wheezing, rhonchi or rales. {no increased work of breathing. CV: Normal S1, S2 without murmurs. Abdomen: Nondistended, nontender. Skin: Warm and dry, without lesions or rashes.  Extremities:  No clubbing, cyanosis or edema. Neuro:   Grossly intact.  Diagnositics/Labs:  Allergy testing: peanut and tree nuts were negative.  Histamine was mildly reactive.  Allergy testing results were read and interpreted by provider, documented by clinical staff.   Assessment and plan:   Angioedema     - onset of lip swelling in relation to nut ingestion is rather delayed and thus is not consistent with IgE mediated food allergy.  He denies any issues with red meat ingestion which is typically delayed.  Most concerned for ACE-I angioedema as he is on Ramipril.  ACE-I angioedema can occur even years of taking the medication and angioedema can persist for months after discontinuation.   Given that he does not have any urticaria with his angioedema is also concerning for possible bradykinin mediated angioedema.  Besides angioedema he does not have any other features/symptoms that would be consistent with mast cell-mediated angioedema.      - allergy testing for possible nut allergy done today and was negative for peanut and tree nuts     - will obtain additional lab work: nut IgE panel, CBC w diff, CMP, inflammatory markers, C4, C1q,  C1 est inb level and function      - at this time would continue avoidance of nuts until labs return     - start Zyrtec 10mg  daily (if swelling persist may increase to twice a day dosing)     - you have access to Epipen      - advised pt to discuss with PCP about stopping Ramipril for non-ACEI option for blood pressure control.        Follow-up 6 months   I appreciate the opportunity to take part in Calvin Pugh's care. Please do not hesitate to contact me with questions.  Sincerely,   Prudy Feeler, MD Allergy/Immunology [facility]

## 2016-06-27 NOTE — Patient Instructions (Addendum)
Lip swelling     - allergy testing for possible nut allergy done today and was negative for peanut and tree nuts     - will obtain additional lab work: nut IgE panel, CBC w diff, CMP, inflammatory markers, C4, C1q, C1 est inb level and function     - at this time would continue avoidance of nuts until lab work returns     - start Zyrtec 10mg  daily     - you have access to Epipen      - discuss with your PCP about changing your Ramipril (ACE-I) which is known to cause lip and/or tongue swelling        Follow-up 6 months

## 2016-07-04 ENCOUNTER — Telehealth: Payer: Self-pay

## 2016-07-04 ENCOUNTER — Encounter: Payer: Self-pay | Admitting: Internal Medicine

## 2016-07-04 ENCOUNTER — Ambulatory Visit (INDEPENDENT_AMBULATORY_CARE_PROVIDER_SITE_OTHER): Payer: Medicare Other | Admitting: Internal Medicine

## 2016-07-04 VITALS — BP 138/78 | HR 68 | Temp 98.1°F | Resp 20 | Wt 178.1 lb

## 2016-07-04 DIAGNOSIS — Z23 Encounter for immunization: Secondary | ICD-10-CM

## 2016-07-04 DIAGNOSIS — T783XXA Angioneurotic edema, initial encounter: Secondary | ICD-10-CM

## 2016-07-04 DIAGNOSIS — J438 Other emphysema: Secondary | ICD-10-CM | POA: Diagnosis not present

## 2016-07-04 DIAGNOSIS — I10 Essential (primary) hypertension: Secondary | ICD-10-CM

## 2016-07-04 LAB — COMPREHENSIVE METABOLIC PANEL
ALK PHOS: 73 IU/L (ref 39–117)
ALT: 19 IU/L (ref 0–44)
AST: 23 IU/L (ref 0–40)
Albumin/Globulin Ratio: 1.4 (ref 1.2–2.2)
Albumin: 4.1 g/dL (ref 3.5–4.8)
BILIRUBIN TOTAL: 0.5 mg/dL (ref 0.0–1.2)
BUN / CREAT RATIO: 16 (ref 10–24)
BUN: 16 mg/dL (ref 8–27)
CHLORIDE: 100 mmol/L (ref 96–106)
CO2: 26 mmol/L (ref 18–29)
CREATININE: 1.03 mg/dL (ref 0.76–1.27)
Calcium: 9.5 mg/dL (ref 8.6–10.2)
GFR calc Af Amer: 81 mL/min/{1.73_m2} (ref >59)
GFR calc non Af Amer: 70 mL/min/{1.73_m2} (ref >59)
GLOBULIN, TOTAL: 3 g/dL (ref 1.5–4.5)
Glucose: 85 mg/dL (ref 65–99)
Potassium: 4.8 mmol/L (ref 3.5–5.2)
SODIUM: 140 mmol/L (ref 134–144)
Total Protein: 7.1 g/dL (ref 6.0–8.5)

## 2016-07-04 LAB — COMPLEMENT COMPONENT C1Q: COMPLEMENT C1Q: 14.4 mg/dL (ref 11.8–23.8)

## 2016-07-04 LAB — ALLERGENS(7)
F020-IgE Almond: <0.1 kU/L
Peanut IgE: <0.1 kU/L
Walnut IgE: <0.1 kU/L

## 2016-07-04 LAB — C1 ESTERASE INHIBITOR: C1INH SerPl-mCnc: 24 mg/dL (ref 21–39)

## 2016-07-04 LAB — SEDIMENTATION RATE: SED RATE: 4 mm/h (ref 0–30)

## 2016-07-04 LAB — CBC WITH DIFFERENTIAL/PLATELET
BASOS ABS: 0.1 10*3/uL (ref 0.0–0.2)
BASOS: 1 %
EOS (ABSOLUTE): 0.3 10*3/uL (ref 0.0–0.4)
Eos: 4 %
HEMATOCRIT: 46.4 % (ref 37.5–51.0)
HEMOGLOBIN: 15.9 g/dL (ref 12.6–17.7)
Immature Grans (Abs): 0 10*3/uL (ref 0.0–0.1)
Immature Granulocytes: 0 %
LYMPHS ABS: 1.6 10*3/uL (ref 0.7–3.1)
Lymphs: 22 %
MCH: 31.3 pg (ref 26.6–33.0)
MCHC: 34.3 g/dL (ref 31.5–35.7)
MCV: 91 fL (ref 79–97)
Monocytes Absolute: 0.7 10*3/uL (ref 0.1–0.9)
Monocytes: 9 %
NEUTROS ABS: 4.6 10*3/uL (ref 1.4–7.0)
Neutrophils: 64 %
Platelets: 191 10*3/uL (ref 150–379)
RBC: 5.08 x10E6/uL (ref 4.14–5.80)
RDW: 14.5 % (ref 12.3–15.4)
WBC: 7.3 10*3/uL (ref 3.4–10.8)

## 2016-07-04 LAB — C1 ESTERASE INHIBITOR, FUNCTIONAL: C1INH Functional/C1INH Total MFr SerPl: 98 %mean normal

## 2016-07-04 LAB — C4 COMPLEMENT: Complement C4, Serum: 23 mg/dL (ref 14–44)

## 2016-07-04 MED ORDER — TRIAMCINOLONE ACETONIDE 55 MCG/ACT NA AERO: 2.0000 | INHALATION_SPRAY | Freq: Every day | NASAL | 3 refills | Status: DC

## 2016-07-04 MED ORDER — AMLODIPINE BESYLATE 5 MG PO TABS: 5.0000 mg | ORAL_TABLET | Freq: Every day | ORAL | 3 refills | Status: DC

## 2016-07-04 MED ORDER — SILDENAFIL CITRATE 100 MG PO TABS: 50.0000 mg | ORAL_TABLET | Freq: Every day | ORAL | 3 refills | Status: DC | PRN

## 2016-07-04 NOTE — Progress Notes (Signed)
Subjective:    Patient ID: Calvin Pugh, male    DOB: 1936-09-28, 80 y.o.   MRN: [id number]  HPI Here to f/u; overall doing ok,  Pt denies chest pain, increasing sob or doe, wheezing, orthopnea, PND, increased LE swelling, palpitations, dizziness or syncope.  Pt denies new neurological symptoms such as new headache, or facial or extremity weakness or numbness.  Pt denies polydipsia, polyuria, or low sugar episode.   Pt denies new neurological symptoms such as new headache, or facial or extremity weakness or numbness.   Pt states overall good compliance with meds, mostly trying to follow appropriate diet, with wt overall stable,  but little exercise however.  Did have episode after eating pecan pie of what sounds like angioedema upper lip, resolved in 3 days, no fever/pain/itching.   Later lower lip did same after butter pecan ice cream, tx for this at urgent care with IM steroid, predpac and epi pen.  Resolved in 2 days, later ate a small candy with nuts and lower lipi swelling happeened again, better with epipen. Did see allergy after this, skin testing neg, and 9/27 lab for nut allergy neg. Remaiins on ACE for BP only, no hx of CHF.  Has blood tests already, does not want more today, but will do next time.   Past Medical History:  Diagnosis Date  . ALLERGIC RHINITIS 12/08/2007  . ARTHRITIS 07/25/2007  . BENIGN PROSTATIC HYPERTROPHY, HX OF 07/25/2007  . CATARACTS, BILATERAL, HX OF 07/25/2007  . COPD 09/09/2009  . CORONARY ARTERY DISEASE 07/25/2007  . DEGENERATIVE JOINT DISEASE, CERVICAL SPINE 07/25/2007  . DIVERTICULOSIS, COLON 12/08/2007  . DIVERTICULOSIS, COLON, HX OF 07/25/2007  . DIZZINESS 09/08/2009  . FATIGUE 12/08/2007  . GERD 07/25/2007  . GLUCOSE INTOLERANCE 12/08/2007  . HYPERLIPIDEMIA 07/25/2007  . HYPERTENSION 07/25/2007  . Lower back pain 12/17/2011  . Other acquired absence of organ 07/25/2007  . Other postprocedural status(V45.89) 07/25/2007  . PERIPHERAL VASCULAR DISEASE 12/08/2007    . Pulmonary fibrosis ([place]) 07/20/2014  . Solitary pulmonary nodule 09/29/2010  . Wheezing 09/29/2010   Past Surgical History:  Procedure Laterality Date  . APPENDECTOMY    . CARPAL TUNNEL RELEASE    . CATARACT EXTRACTION    . CHOLECYSTECTOMY  2015   [facility]  . HEMORRHOID SURGERY    . rotator cuff repair right    . TONSILLECTOMY      reports that he has quit smoking. He has never used smokeless tobacco. He reports that he drinks alcohol. He reports that he does not use drugs. family history includes Allergic rhinitis in his son; COPD in his father; Cancer in his father; Heart disease in his mother; Hypertension in his sister. Allergies  Allergen Reactions  . Codeine     REACTION: causes nausea  . Isosorbide Mononitrate     REACTION: severe h/a   Current Outpatient Prescriptions on File Prior to Visit  Medication Sig Dispense Refill  . albuterol (PROVENTIL HFA;VENTOLIN HFA) 108 (90 BASE) MCG/ACT inhaler Inhale 1 puff into the lungs every 6 (six) hours as needed for wheezing or shortness of breath.    Marland Kitchen aspirin 81 MG tablet Take 1 tablet (81 mg total) by mouth daily. 30 tablet 11  . atorvastatin (LIPITOR) 20 MG tablet Take 1 tablet (20 mg total) by mouth daily. 14 tablet 0  . Biotin 5000 MCG TABS Take by mouth.    . cyclobenzaprine (FLEXERIL) 5 MG tablet Take 1 tablet (5 mg total) by mouth 3 (three)  times daily as needed for muscle spasms. 60 tablet 5  . EPINEPHrine (EPIPEN 2-PAK) 0.3 mg/0.3 mL IJ SOAJ injection Inject 0.3 mg into the muscle once.    . Fluticasone-Salmeterol (ADVAIR) 250-50 MCG/DOSE AEPB Inhale 1 puff into the lungs 2 (two) times daily.    . meclizine (ANTIVERT) 12.5 MG tablet Take 1 tablet (12.5 mg total) by mouth 3 (three) times daily as needed. 30 tablet 5  . pantoprazole (PROTONIX) 40 MG tablet Take 1 tablet (40 mg total) by mouth daily. 90 tablet 3  . Tiotropium Bromide Monohydrate (SPIRIVA RESPIMAT) 2.5 MCG/ACT AERS Inhale into the lungs.     No  current facility-administered medications on file prior to visit.    Review of Systems  Constitutional: Negative for unusual diaphoresis or night sweats HENT: Negative for ear swelling or discharge Eyes: Negative for worsening visual haziness  Respiratory: Negative for choking and stridor.   Gastrointestinal: Negative for distension or worsening eructation Genitourinary: Negative for retention or change in urine volume.  Musculoskeletal: Negative for other MSK pain or swelling Skin: Negative for color change and worsening wound Neurological: Negative for tremors and numbness other than noted  Psychiatric/Behavioral: Negative for decreased concentration or agitation other than above       Objective:   Physical Exam BP 138/78   Pulse 68   Temp 98.1 F (36.7 C) (Oral)   Resp 20   Wt 178 lb 2 oz (80.8 kg)   SpO2 94%   BMI 26.53 kg/m  VS noted,  Constitutional: Pt appears in no apparent distress HENT: Head: NCAT.  Right Ear: External ear normal.  Left Ear: External ear normal.  Eyes: . Pupils are equal, round, and reactive to light. Conjunctivae and EOM are normal Neck: Normal range of motion. Neck supple.  Cardiovascular: Normal rate and regular rhythm.   Pulmonary/Chest: Effort normal and breath sounds decreased without rales or wheezing.  Abd:  Soft, NT, ND, + BS Neurological: Pt is alert. Not confused , motor grossly intact Skin: Skin is warm. No rash, no LE edema Psychiatric: Pt behavior is normal. No agitation.      Assessment & Plan:

## 2016-07-04 NOTE — Patient Instructions (Addendum)
OK to stop the ramipril  Please take all new medication as prescribed - the amlodipine 5 mg per day  You had the flu shot today  Please continue all other medications as before, and refills have been done if requested.  Please have the pharmacy call with any other refills you may need.  Please continue your efforts at being more active, low cholesterol diet, and weight control.  Please keep your appointments with your specialists as you may have planned  Please return in 6 months, or sooner if needed

## 2016-07-04 NOTE — Progress Notes (Signed)
Pre visit review using our clinic review tool, if applicable. No additional management support is needed unless otherwise documented below in the visit note. 

## 2016-07-04 NOTE — Telephone Encounter (Signed)
Refills sent to pharmacy. 

## 2016-07-07 NOTE — Assessment & Plan Note (Signed)
Mild recurrent, none today but to stop the ACE, benadryl 50 mg otc prn,  to f/u any worsening symptoms or concerns

## 2016-07-07 NOTE — Assessment & Plan Note (Signed)
stable overall by history and exam, recent data reviewed with pt, and pt to continue medical treatment as before,  to f/u any worsening symptoms or concerns @LASTSAO2(3)@  

## 2016-07-07 NOTE — Assessment & Plan Note (Signed)
BP Readings from Last 3 Encounters:  07/04/16 138/78  06/27/16 (!) 170/78  01/03/16 126/76   Stable, but with d/c ACE, will start amlod 5 qd

## 2016-07-09 ENCOUNTER — Telehealth: Payer: Self-pay | Admitting: *Deleted

## 2016-07-09 ENCOUNTER — Other Ambulatory Visit: Payer: Self-pay | Admitting: *Deleted

## 2016-07-09 MED ORDER — AMLODIPINE BESYLATE 5 MG PO TABS: 5.0000 mg | ORAL_TABLET | Freq: Every day | ORAL | 3 refills | Status: DC

## 2016-07-09 MED ORDER — CETIRIZINE HCL 10 MG PO TABS: 10.0000 mg | ORAL_TABLET | Freq: Every day | ORAL | 3 refills | Status: DC

## 2016-07-09 MED ORDER — SILDENAFIL CITRATE 100 MG PO TABS: 50.0000 mg | ORAL_TABLET | Freq: Every day | ORAL | 3 refills | Status: DC | PRN

## 2016-07-09 MED ORDER — TRIAMCINOLONE ACETONIDE 55 MCG/ACT NA AERO: 2.0000 | INHALATION_SPRAY | Freq: Every day | NASAL | 3 refills | Status: DC

## 2016-07-09 NOTE — Telephone Encounter (Signed)
Rec'd call pt states he saw MD last week  And told the nurse that he [facility] but [facility] have not received no prescriptions. Inform pt per chart rx for Amlodipine was sent, but the other two meds nasacort & viagra was sent to [facility]. Pt states he needed [facility], along with zyrtec. Inform [facility]...Johny Chess

## 2016-07-18 ENCOUNTER — Telehealth: Payer: Self-pay | Admitting: *Deleted

## 2016-07-18 MED ORDER — FLUTICASONE PROPIONATE 50 MCG/ACT NA SUSP
2.0000 | Freq: Every day | NASAL | 3 refills | Status: DC
Start: 2016-07-18 — End: 2018-01-07

## 2016-07-18 NOTE — Telephone Encounter (Signed)
Rec'd fax stating medication Triamcinolone AC Nasal spray 93mcg has been discontinued. The brand Nasacort AQ version is also discontinued. A possibel alternative is Fluticasone Prop Nasal spray. Pls advise on alternative...Calvin Pugh

## 2016-07-18 NOTE — Telephone Encounter (Signed)
[facility] for change nasacort to fluticasone - done erx

## 2016-08-07 DIAGNOSIS — M47894 Other spondylosis, thoracic region: Secondary | ICD-10-CM | POA: Diagnosis not present

## 2016-08-07 DIAGNOSIS — J432 Centrilobular emphysema: Secondary | ICD-10-CM | POA: Diagnosis not present

## 2016-08-07 DIAGNOSIS — J8410 Pulmonary fibrosis, unspecified: Secondary | ICD-10-CM | POA: Diagnosis not present

## 2016-08-07 DIAGNOSIS — R911 Solitary pulmonary nodule: Secondary | ICD-10-CM | POA: Diagnosis not present

## 2016-08-07 DIAGNOSIS — I2510 Atherosclerotic heart disease of native coronary artery without angina pectoris: Secondary | ICD-10-CM | POA: Diagnosis not present

## 2016-08-07 DIAGNOSIS — I700 Atherosclerosis of aorta: Secondary | ICD-10-CM | POA: Diagnosis not present

## 2016-08-07 DIAGNOSIS — K5730 Diverticulosis of large intestine without perforation or abscess without bleeding: Secondary | ICD-10-CM | POA: Diagnosis not present

## 2016-08-15 DIAGNOSIS — I700 Atherosclerosis of aorta: Secondary | ICD-10-CM | POA: Diagnosis not present

## 2016-08-15 DIAGNOSIS — R918 Other nonspecific abnormal finding of lung field: Secondary | ICD-10-CM | POA: Diagnosis not present

## 2016-08-15 DIAGNOSIS — K5730 Diverticulosis of large intestine without perforation or abscess without bleeding: Secondary | ICD-10-CM | POA: Diagnosis not present

## 2016-08-15 DIAGNOSIS — J439 Emphysema, unspecified: Secondary | ICD-10-CM | POA: Diagnosis not present

## 2016-08-28 DIAGNOSIS — J84112 Idiopathic pulmonary fibrosis: Secondary | ICD-10-CM | POA: Diagnosis not present

## 2016-08-28 DIAGNOSIS — J449 Chronic obstructive pulmonary disease, unspecified: Secondary | ICD-10-CM | POA: Diagnosis not present

## 2016-08-28 DIAGNOSIS — R918 Other nonspecific abnormal finding of lung field: Secondary | ICD-10-CM | POA: Diagnosis not present

## 2016-10-23 DIAGNOSIS — M5136 Other intervertebral disc degeneration, lumbar region: Secondary | ICD-10-CM | POA: Diagnosis not present

## 2016-10-23 DIAGNOSIS — M9903 Segmental and somatic dysfunction of lumbar region: Secondary | ICD-10-CM | POA: Diagnosis not present

## 2016-10-23 DIAGNOSIS — M9904 Segmental and somatic dysfunction of sacral region: Secondary | ICD-10-CM | POA: Diagnosis not present

## 2016-10-23 DIAGNOSIS — M5137 Other intervertebral disc degeneration, lumbosacral region: Secondary | ICD-10-CM | POA: Diagnosis not present

## 2016-10-23 DIAGNOSIS — M5442 Lumbago with sciatica, left side: Secondary | ICD-10-CM | POA: Diagnosis not present

## 2016-10-24 DIAGNOSIS — M9904 Segmental and somatic dysfunction of sacral region: Secondary | ICD-10-CM | POA: Diagnosis not present

## 2016-10-24 DIAGNOSIS — M5137 Other intervertebral disc degeneration, lumbosacral region: Secondary | ICD-10-CM | POA: Diagnosis not present

## 2016-10-24 DIAGNOSIS — M5442 Lumbago with sciatica, left side: Secondary | ICD-10-CM | POA: Diagnosis not present

## 2016-10-24 DIAGNOSIS — M9903 Segmental and somatic dysfunction of lumbar region: Secondary | ICD-10-CM | POA: Diagnosis not present

## 2016-10-24 DIAGNOSIS — M5136 Other intervertebral disc degeneration, lumbar region: Secondary | ICD-10-CM | POA: Diagnosis not present

## 2016-10-26 DIAGNOSIS — M9903 Segmental and somatic dysfunction of lumbar region: Secondary | ICD-10-CM | POA: Diagnosis not present

## 2016-10-26 DIAGNOSIS — M5136 Other intervertebral disc degeneration, lumbar region: Secondary | ICD-10-CM | POA: Diagnosis not present

## 2016-10-26 DIAGNOSIS — M5137 Other intervertebral disc degeneration, lumbosacral region: Secondary | ICD-10-CM | POA: Diagnosis not present

## 2016-10-26 DIAGNOSIS — M9904 Segmental and somatic dysfunction of sacral region: Secondary | ICD-10-CM | POA: Diagnosis not present

## 2016-10-26 DIAGNOSIS — M5442 Lumbago with sciatica, left side: Secondary | ICD-10-CM | POA: Diagnosis not present

## 2016-10-29 DIAGNOSIS — M5442 Lumbago with sciatica, left side: Secondary | ICD-10-CM | POA: Diagnosis not present

## 2016-10-29 DIAGNOSIS — M5136 Other intervertebral disc degeneration, lumbar region: Secondary | ICD-10-CM | POA: Diagnosis not present

## 2016-10-29 DIAGNOSIS — M5137 Other intervertebral disc degeneration, lumbosacral region: Secondary | ICD-10-CM | POA: Diagnosis not present

## 2016-10-29 DIAGNOSIS — M9904 Segmental and somatic dysfunction of sacral region: Secondary | ICD-10-CM | POA: Diagnosis not present

## 2016-10-29 DIAGNOSIS — M9903 Segmental and somatic dysfunction of lumbar region: Secondary | ICD-10-CM | POA: Diagnosis not present

## 2016-10-31 DIAGNOSIS — M5137 Other intervertebral disc degeneration, lumbosacral region: Secondary | ICD-10-CM | POA: Diagnosis not present

## 2016-10-31 DIAGNOSIS — M5136 Other intervertebral disc degeneration, lumbar region: Secondary | ICD-10-CM | POA: Diagnosis not present

## 2016-10-31 DIAGNOSIS — M5442 Lumbago with sciatica, left side: Secondary | ICD-10-CM | POA: Diagnosis not present

## 2016-10-31 DIAGNOSIS — M9903 Segmental and somatic dysfunction of lumbar region: Secondary | ICD-10-CM | POA: Diagnosis not present

## 2016-10-31 DIAGNOSIS — M9904 Segmental and somatic dysfunction of sacral region: Secondary | ICD-10-CM | POA: Diagnosis not present

## 2016-11-02 DIAGNOSIS — M9904 Segmental and somatic dysfunction of sacral region: Secondary | ICD-10-CM | POA: Diagnosis not present

## 2016-11-02 DIAGNOSIS — M5136 Other intervertebral disc degeneration, lumbar region: Secondary | ICD-10-CM | POA: Diagnosis not present

## 2016-11-02 DIAGNOSIS — M9903 Segmental and somatic dysfunction of lumbar region: Secondary | ICD-10-CM | POA: Diagnosis not present

## 2016-11-02 DIAGNOSIS — M5137 Other intervertebral disc degeneration, lumbosacral region: Secondary | ICD-10-CM | POA: Diagnosis not present

## 2016-11-02 DIAGNOSIS — M5442 Lumbago with sciatica, left side: Secondary | ICD-10-CM | POA: Diagnosis not present

## 2016-11-05 DIAGNOSIS — I2510 Atherosclerotic heart disease of native coronary artery without angina pectoris: Secondary | ICD-10-CM | POA: Diagnosis not present

## 2016-11-05 DIAGNOSIS — M9904 Segmental and somatic dysfunction of sacral region: Secondary | ICD-10-CM | POA: Diagnosis not present

## 2016-11-05 DIAGNOSIS — M5136 Other intervertebral disc degeneration, lumbar region: Secondary | ICD-10-CM | POA: Diagnosis not present

## 2016-11-05 DIAGNOSIS — M5137 Other intervertebral disc degeneration, lumbosacral region: Secondary | ICD-10-CM | POA: Diagnosis not present

## 2016-11-05 DIAGNOSIS — M5442 Lumbago with sciatica, left side: Secondary | ICD-10-CM | POA: Diagnosis not present

## 2016-11-05 DIAGNOSIS — J439 Emphysema, unspecified: Secondary | ICD-10-CM | POA: Diagnosis not present

## 2016-11-05 DIAGNOSIS — M9903 Segmental and somatic dysfunction of lumbar region: Secondary | ICD-10-CM | POA: Diagnosis not present

## 2016-11-05 DIAGNOSIS — J90 Pleural effusion, not elsewhere classified: Secondary | ICD-10-CM | POA: Diagnosis not present

## 2016-11-05 DIAGNOSIS — J929 Pleural plaque without asbestos: Secondary | ICD-10-CM | POA: Diagnosis not present

## 2016-11-07 DIAGNOSIS — M9904 Segmental and somatic dysfunction of sacral region: Secondary | ICD-10-CM | POA: Diagnosis not present

## 2016-11-07 DIAGNOSIS — M5442 Lumbago with sciatica, left side: Secondary | ICD-10-CM | POA: Diagnosis not present

## 2016-11-07 DIAGNOSIS — M9903 Segmental and somatic dysfunction of lumbar region: Secondary | ICD-10-CM | POA: Diagnosis not present

## 2016-11-07 DIAGNOSIS — M5137 Other intervertebral disc degeneration, lumbosacral region: Secondary | ICD-10-CM | POA: Diagnosis not present

## 2016-11-07 DIAGNOSIS — M5136 Other intervertebral disc degeneration, lumbar region: Secondary | ICD-10-CM | POA: Diagnosis not present

## 2016-11-09 DIAGNOSIS — M5136 Other intervertebral disc degeneration, lumbar region: Secondary | ICD-10-CM | POA: Diagnosis not present

## 2016-11-09 DIAGNOSIS — M9903 Segmental and somatic dysfunction of lumbar region: Secondary | ICD-10-CM | POA: Diagnosis not present

## 2016-11-09 DIAGNOSIS — M5442 Lumbago with sciatica, left side: Secondary | ICD-10-CM | POA: Diagnosis not present

## 2016-11-09 DIAGNOSIS — M9904 Segmental and somatic dysfunction of sacral region: Secondary | ICD-10-CM | POA: Diagnosis not present

## 2016-11-09 DIAGNOSIS — M5137 Other intervertebral disc degeneration, lumbosacral region: Secondary | ICD-10-CM | POA: Diagnosis not present

## 2016-11-12 DIAGNOSIS — M5136 Other intervertebral disc degeneration, lumbar region: Secondary | ICD-10-CM | POA: Diagnosis not present

## 2016-11-12 DIAGNOSIS — M5137 Other intervertebral disc degeneration, lumbosacral region: Secondary | ICD-10-CM | POA: Diagnosis not present

## 2016-11-12 DIAGNOSIS — M9904 Segmental and somatic dysfunction of sacral region: Secondary | ICD-10-CM | POA: Diagnosis not present

## 2016-11-12 DIAGNOSIS — M9903 Segmental and somatic dysfunction of lumbar region: Secondary | ICD-10-CM | POA: Diagnosis not present

## 2016-11-12 DIAGNOSIS — M5442 Lumbago with sciatica, left side: Secondary | ICD-10-CM | POA: Diagnosis not present

## 2016-11-14 DIAGNOSIS — M5137 Other intervertebral disc degeneration, lumbosacral region: Secondary | ICD-10-CM | POA: Diagnosis not present

## 2016-11-14 DIAGNOSIS — M5442 Lumbago with sciatica, left side: Secondary | ICD-10-CM | POA: Diagnosis not present

## 2016-11-14 DIAGNOSIS — M5136 Other intervertebral disc degeneration, lumbar region: Secondary | ICD-10-CM | POA: Diagnosis not present

## 2016-11-14 DIAGNOSIS — M9904 Segmental and somatic dysfunction of sacral region: Secondary | ICD-10-CM | POA: Diagnosis not present

## 2016-11-14 DIAGNOSIS — M9903 Segmental and somatic dysfunction of lumbar region: Secondary | ICD-10-CM | POA: Diagnosis not present

## 2016-11-16 DIAGNOSIS — M9904 Segmental and somatic dysfunction of sacral region: Secondary | ICD-10-CM | POA: Diagnosis not present

## 2016-11-16 DIAGNOSIS — M9903 Segmental and somatic dysfunction of lumbar region: Secondary | ICD-10-CM | POA: Diagnosis not present

## 2016-11-16 DIAGNOSIS — M5136 Other intervertebral disc degeneration, lumbar region: Secondary | ICD-10-CM | POA: Diagnosis not present

## 2016-11-16 DIAGNOSIS — M5137 Other intervertebral disc degeneration, lumbosacral region: Secondary | ICD-10-CM | POA: Diagnosis not present

## 2016-11-16 DIAGNOSIS — M5442 Lumbago with sciatica, left side: Secondary | ICD-10-CM | POA: Diagnosis not present

## 2016-11-19 DIAGNOSIS — M5442 Lumbago with sciatica, left side: Secondary | ICD-10-CM | POA: Diagnosis not present

## 2016-11-19 DIAGNOSIS — M9904 Segmental and somatic dysfunction of sacral region: Secondary | ICD-10-CM | POA: Diagnosis not present

## 2016-11-19 DIAGNOSIS — M9903 Segmental and somatic dysfunction of lumbar region: Secondary | ICD-10-CM | POA: Diagnosis not present

## 2016-11-19 DIAGNOSIS — M5137 Other intervertebral disc degeneration, lumbosacral region: Secondary | ICD-10-CM | POA: Diagnosis not present

## 2016-11-19 DIAGNOSIS — M5136 Other intervertebral disc degeneration, lumbar region: Secondary | ICD-10-CM | POA: Diagnosis not present

## 2016-11-21 DIAGNOSIS — M5137 Other intervertebral disc degeneration, lumbosacral region: Secondary | ICD-10-CM | POA: Diagnosis not present

## 2016-11-21 DIAGNOSIS — M5136 Other intervertebral disc degeneration, lumbar region: Secondary | ICD-10-CM | POA: Diagnosis not present

## 2016-11-21 DIAGNOSIS — M9903 Segmental and somatic dysfunction of lumbar region: Secondary | ICD-10-CM | POA: Diagnosis not present

## 2016-11-21 DIAGNOSIS — M5442 Lumbago with sciatica, left side: Secondary | ICD-10-CM | POA: Diagnosis not present

## 2016-11-21 DIAGNOSIS — M9904 Segmental and somatic dysfunction of sacral region: Secondary | ICD-10-CM | POA: Diagnosis not present

## 2016-11-23 DIAGNOSIS — M9903 Segmental and somatic dysfunction of lumbar region: Secondary | ICD-10-CM | POA: Diagnosis not present

## 2016-11-23 DIAGNOSIS — M5136 Other intervertebral disc degeneration, lumbar region: Secondary | ICD-10-CM | POA: Diagnosis not present

## 2016-11-23 DIAGNOSIS — M5442 Lumbago with sciatica, left side: Secondary | ICD-10-CM | POA: Diagnosis not present

## 2016-11-23 DIAGNOSIS — M5137 Other intervertebral disc degeneration, lumbosacral region: Secondary | ICD-10-CM | POA: Diagnosis not present

## 2016-11-23 DIAGNOSIS — M9904 Segmental and somatic dysfunction of sacral region: Secondary | ICD-10-CM | POA: Diagnosis not present

## 2016-12-12 DIAGNOSIS — R918 Other nonspecific abnormal finding of lung field: Secondary | ICD-10-CM | POA: Diagnosis not present

## 2016-12-12 DIAGNOSIS — J449 Chronic obstructive pulmonary disease, unspecified: Secondary | ICD-10-CM | POA: Diagnosis not present

## 2016-12-15 ENCOUNTER — Other Ambulatory Visit: Payer: Self-pay | Admitting: Internal Medicine

## 2016-12-24 ENCOUNTER — Telehealth: Payer: Self-pay | Admitting: Internal Medicine

## 2016-12-24 NOTE — Telephone Encounter (Signed)
Routing to dr john, please advise, thanks 

## 2016-12-24 NOTE — Telephone Encounter (Signed)
Louise from the [facility] in [place] said that a pt of Dr Judi Cong was there to establish primary care and it was found that he had an irregular pulse. They did an EKG and it showed that he has atrial fibrillation. The pt was notified and he stated that he would like for Dr Jenny Reichmann to manage this. They are faxing over the results. Barbaraann Share would like for someone to call her back when the results are received so that she will know that he is receiving care regarding this issue.  Thanks [employer]

## 2016-12-25 NOTE — Telephone Encounter (Signed)
Advised patient of dr Judi Cong note, he has made appt for 4/6

## 2016-12-25 NOTE — Telephone Encounter (Signed)
I have not seen any message or form to date.  OK for pt to make ROV at his convenience or first availble

## 2017-01-04 ENCOUNTER — Ambulatory Visit (INDEPENDENT_AMBULATORY_CARE_PROVIDER_SITE_OTHER): Payer: Medicare Other | Admitting: Internal Medicine

## 2017-01-04 ENCOUNTER — Encounter: Payer: Self-pay | Admitting: Internal Medicine

## 2017-01-04 ENCOUNTER — Other Ambulatory Visit (INDEPENDENT_AMBULATORY_CARE_PROVIDER_SITE_OTHER): Payer: Medicare Other

## 2017-01-04 VITALS — BP 138/78 | HR 72 | Temp 98.1°F | Ht 71.0 in | Wt 180.0 lb

## 2017-01-04 DIAGNOSIS — R7302 Impaired glucose tolerance (oral): Secondary | ICD-10-CM

## 2017-01-04 DIAGNOSIS — N320 Bladder-neck obstruction: Secondary | ICD-10-CM

## 2017-01-04 DIAGNOSIS — Z0000 Encounter for general adult medical examination without abnormal findings: Secondary | ICD-10-CM

## 2017-01-04 DIAGNOSIS — E785 Hyperlipidemia, unspecified: Secondary | ICD-10-CM

## 2017-01-04 DIAGNOSIS — I10 Essential (primary) hypertension: Secondary | ICD-10-CM | POA: Diagnosis not present

## 2017-01-04 LAB — CBC WITH DIFFERENTIAL/PLATELET
Basophils Absolute: 0.1 10*3/uL (ref 0.0–0.1)
Basophils Relative: 0.5 % (ref 0.0–3.0)
EOS PCT: 2.9 % (ref 0.0–5.0)
Eosinophils Absolute: 0.3 10*3/uL (ref 0.0–0.7)
HEMATOCRIT: 45.6 % (ref 39.0–52.0)
Hemoglobin: 15.5 g/dL (ref 13.0–17.0)
LYMPHS PCT: 11.1 % — AB (ref 12.0–46.0)
Lymphs Abs: 1.2 10*3/uL (ref 0.7–4.0)
MCHC: 34 g/dL (ref 30.0–36.0)
MCV: 89.7 fl (ref 78.0–100.0)
MONOS PCT: 8.5 % (ref 3.0–12.0)
Monocytes Absolute: 0.9 10*3/uL (ref 0.1–1.0)
NEUTROS ABS: 8.3 10*3/uL — AB (ref 1.4–7.7)
Neutrophils Relative %: 77 % (ref 43.0–77.0)
Platelets: 212 10*3/uL (ref 150.0–400.0)
RBC: 5.09 Mil/uL (ref 4.22–5.81)
RDW: 16 % — ABNORMAL HIGH (ref 11.5–15.5)
WBC: 10.7 10*3/uL — ABNORMAL HIGH (ref 4.0–10.5)

## 2017-01-04 LAB — BASIC METABOLIC PANEL
BUN: 18 mg/dL (ref 6–23)
CALCIUM: 9.8 mg/dL (ref 8.4–10.5)
CO2: 31 meq/L (ref 19–32)
CREATININE: 1.06 mg/dL (ref 0.40–1.50)
Chloride: 101 mEq/L (ref 96–112)
GFR: 71.74 mL/min (ref >60.00)
Glucose, Bld: 111 mg/dL — ABNORMAL HIGH (ref 70–99)
Potassium: 5.1 mEq/L (ref 3.5–5.1)
SODIUM: 138 meq/L (ref 135–145)

## 2017-01-04 LAB — URINALYSIS, ROUTINE W REFLEX MICROSCOPIC
BILIRUBIN URINE: NEGATIVE
HGB URINE DIPSTICK: NEGATIVE
Ketones, ur: NEGATIVE
LEUKOCYTES UA: NEGATIVE
Nitrite: NEGATIVE
RBC / HPF: NONE SEEN (ref 0–?)
Specific Gravity, Urine: 1.01 (ref 1.000–1.030)
Total Protein, Urine: NEGATIVE
UROBILINOGEN UA: 0.2 (ref 0.0–1.0)
Urine Glucose: NEGATIVE
pH: 7 (ref 5.0–8.0)

## 2017-01-04 LAB — HEPATIC FUNCTION PANEL
ALK PHOS: 84 U/L (ref 39–117)
ALT: 27 U/L (ref 0–53)
AST: 23 U/L (ref 0–37)
Albumin: 4.1 g/dL (ref 3.5–5.2)
BILIRUBIN DIRECT: 0.2 mg/dL (ref 0.0–0.3)
Total Bilirubin: 0.7 mg/dL (ref 0.2–1.2)
Total Protein: 7.4 g/dL (ref 6.0–8.3)

## 2017-01-04 LAB — LIPID PANEL
CHOL/HDL RATIO: 2
Cholesterol: 116 mg/dL (ref 0–200)
HDL: 51 mg/dL (ref >39.00)
LDL CALC: 54 mg/dL (ref 0–99)
NONHDL: 64.73
Triglycerides: 54 mg/dL (ref 0.0–149.0)
VLDL: 10.8 mg/dL (ref 0.0–40.0)

## 2017-01-04 LAB — HEMOGLOBIN A1C: HEMOGLOBIN A1C: 6 % (ref 4.6–6.5)

## 2017-01-04 LAB — TSH: TSH: 5.39 u[IU]/mL — ABNORMAL HIGH (ref 0.35–4.50)

## 2017-01-04 LAB — PSA: PSA: 0.56 ng/mL (ref 0.10–4.00)

## 2017-01-04 MED ORDER — MELOXICAM 15 MG PO TABS: 15.0000 mg | ORAL_TABLET | Freq: Every day | ORAL | 3 refills | Status: DC

## 2017-01-04 MED ORDER — CETIRIZINE HCL 10 MG PO TABS: 10.0000 mg | ORAL_TABLET | Freq: Every day | ORAL | 3 refills | Status: DC

## 2017-01-04 MED ORDER — ATORVASTATIN CALCIUM 20 MG PO TABS
20.0000 mg | ORAL_TABLET | Freq: Every day | ORAL | 3 refills | Status: DC
Start: 2017-01-04 — End: 2018-01-07

## 2017-01-04 MED ORDER — ATORVASTATIN CALCIUM 20 MG PO TABS: 20.0000 mg | ORAL_TABLET | Freq: Every day | ORAL | 3 refills | Status: DC

## 2017-01-04 MED ORDER — AMLODIPINE BESYLATE 5 MG PO TABS: 5.0000 mg | ORAL_TABLET | Freq: Every day | ORAL | 3 refills | Status: DC

## 2017-01-04 MED ORDER — PANTOPRAZOLE SODIUM 40 MG PO TBEC: 40.0000 mg | DELAYED_RELEASE_TABLET | Freq: Every day | ORAL | 3 refills | Status: DC

## 2017-01-04 NOTE — Patient Instructions (Signed)
Please continue all other medications as before, and refills have been done if requested.  Please have the pharmacy call with any other refills you may need.  Please continue your efforts at being more active, low cholesterol diet, and weight control.  You are otherwise up to date with prevention measures today.  Please keep your appointments with your specialists as you may have planned  Please go to the LAB in the Basement (turn left off the elevator) for the tests to be done today  You will be contacted by phone if any changes need to be made immediately.  Otherwise, you will receive a letter about your results with an explanation, but please check with MyChart first.  Please remember to sign up for MyChart if you have not done so, as this will be important to you in the future with finding out test results, communicating by private email, and scheduling acute appointments online when needed.  Please return in 1 year for your yearly visit, or sooner if needed, 

## 2017-01-04 NOTE — Progress Notes (Signed)
Pre visit review using our clinic review tool, if applicable. No additional management support is needed unless otherwise documented below in the visit note. 

## 2017-01-04 NOTE — Progress Notes (Signed)
Subjective:    Patient ID: Calvin Pugh, male    DOB: Nov 09, 1935, 81 y.o.   MRN: [id number]  HPI  Here for yearly f/u;  Overall doing ok;  Pt denies Chest pain, worsening SOB, DOE, wheezing, orthopnea, PND, worsening LE edema, palpitations, dizziness or syncope.  Pt denies neurological change such as new headache, facial or extremity weakness.  Pt denies polydipsia, polyuria, or low sugar symptoms. Pt states overall good compliance with treatment and medications, good tolerability, and has been trying to follow appropriate diet.  Pt denies worsening depressive symptoms, suicidal ideation or panic. No fever, night sweats, wt loss, loss of appetite, or other constitutional symptoms.  Pt states good ability with ADL's, has low fall risk, home safety reviewed and adequate, no other significant changes in hearing or vision, and only occasionally active with exercise.  No other new history. Wt Readings from Last 3 Encounters:  01/04/17 180 lb (81.6 kg)  07/04/16 178 lb 2 oz (80.8 kg)  06/27/16 176 lb 12.8 oz (80.2 kg)   BP Readings from Last 3 Encounters:  01/04/17 138/78  07/04/16 138/78  06/27/16 (!) 170/78  Has f/u colonoscopy due next month.  Has hadd more stress recently due to wife's dementia worsening.  Past Medical History:  Diagnosis Date  . ALLERGIC RHINITIS 12/08/2007  . ARTHRITIS 07/25/2007  . BENIGN PROSTATIC HYPERTROPHY, HX OF 07/25/2007  . CATARACTS, BILATERAL, HX OF 07/25/2007  . COPD 09/09/2009  . CORONARY ARTERY DISEASE 07/25/2007  . DEGENERATIVE JOINT DISEASE, CERVICAL SPINE 07/25/2007  . DIVERTICULOSIS, COLON 12/08/2007  . DIVERTICULOSIS, COLON, HX OF 07/25/2007  . DIZZINESS 09/08/2009  . FATIGUE 12/08/2007  . GERD 07/25/2007  . GLUCOSE INTOLERANCE 12/08/2007  . HYPERLIPIDEMIA 07/25/2007  . HYPERTENSION 07/25/2007  . Lower back pain 12/17/2011  . Other acquired absence of organ 07/25/2007  . Other postprocedural status(V45.89) 07/25/2007  . PERIPHERAL VASCULAR DISEASE  12/08/2007  . Pulmonary fibrosis ([place]) 07/20/2014  . Solitary pulmonary nodule 09/29/2010  . Wheezing 09/29/2010   Past Surgical History:  Procedure Laterality Date  . APPENDECTOMY    . CARPAL TUNNEL RELEASE    . CATARACT EXTRACTION    . CHOLECYSTECTOMY  2015   [facility]  . HEMORRHOID SURGERY    . rotator cuff repair right    . TONSILLECTOMY      reports that he has quit smoking. He has never used smokeless tobacco. He reports that he drinks alcohol. He reports that he does not use drugs. family history includes Allergic rhinitis in his son; COPD in his father; Cancer in his father; Heart disease in his mother; Hypertension in his sister. Allergies  Allergen Reactions  . Codeine     REACTION: causes nausea  . Isosorbide Mononitrate     REACTION: severe h/a   Current Outpatient Prescriptions on File Prior to Visit  Medication Sig Dispense Refill  . albuterol (PROVENTIL HFA;VENTOLIN HFA) 108 (90 BASE) MCG/ACT inhaler Inhale 1 puff into the lungs every 6 (six) hours as needed for wheezing or shortness of breath.    Marland Kitchen aspirin 81 MG tablet Take 1 tablet (81 mg total) by mouth daily. 30 tablet 11  . Biotin 5000 MCG TABS Take by mouth.    . cyclobenzaprine (FLEXERIL) 5 MG tablet Take 1 tablet (5 mg total) by mouth 3 (three) times daily as needed for muscle spasms. 60 tablet 5  . EPINEPHrine (EPIPEN 2-PAK) 0.3 mg/0.3 mL IJ SOAJ injection Inject 0.3 mg into the muscle once.    Marland Kitchen  fluticasone (FLONASE) 50 MCG/ACT nasal spray Place 2 sprays into both nostrils daily. 48 g 3  . Fluticasone-Salmeterol (ADVAIR) 250-50 MCG/DOSE AEPB Inhale 1 puff into the lungs 2 (two) times daily.    . meclizine (ANTIVERT) 12.5 MG tablet Take 1 tablet (12.5 mg total) by mouth 3 (three) times daily as needed. 30 tablet 5  . sildenafil (VIAGRA) 100 MG tablet Take 0.5-1 tablets (50-100 mg total) by mouth daily as needed for erectile dysfunction. 24 tablet 3  . Tiotropium Bromide Monohydrate (SPIRIVA RESPIMAT)  2.5 MCG/ACT AERS Inhale into the lungs.     No current facility-administered medications on file prior to visit.    Review of Systems Constitutional: Negative for other unusual diaphoresis, sweats, appetite or weight changes HENT: Negative for other worsening hearing loss, ear pain, facial swelling, mouth sores or neck stiffness.   Eyes: Negative for other worsening pain, redness or other visual disturbance.  Respiratory: Negative for other stridor or swelling Cardiovascular: Negative for other palpitations or other chest pain  Gastrointestinal: Negative for worsening diarrhea or loose stools, blood in stool, distention or other pain Genitourinary: Negative for hematuria, flank pain or other change in urine volume.  Musculoskeletal: Negative for myalgias or other joint swelling.  Skin: Negative for other color change, or other wound or worsening drainage.  Neurological: Negative for other syncope or numbness. Hematological: Negative for other adenopathy or swelling Psychiatric/Behavioral: Negative for hallucinations, other worsening agitation, SI, self-injury, or new decreased concentration All other system neg per pt    Objective:   Physical Exam BP 138/78   Pulse 72   Temp 98.1 F (36.7 C) (Oral)   Ht 5\' 11"  (1.803 m)   Wt 180 lb (81.6 kg)   SpO2 95%   BMI 25.10 kg/m  VS noted,  Constitutional: Pt is oriented to person, place, and time. Appears well-developed and well-nourished, in no significant distress and comfortable Head: Normocephalic and atraumatic  Eyes: Conjunctivae and EOM are normal. Pupils are equal, round, and reactive to light Right Ear: External ear normal without discharge Left Ear: External ear normal without discharge Nose: Nose without discharge or deformity Mouth/Throat: Oropharynx is without other ulcerations and moist  Neck: Normal range of motion. Neck supple. No JVD present. No tracheal deviation present or significant neck LA or mass Cardiovascular:  Normal rate, regular rhythm, normal heart sounds and intact distal pulses.   Pulmonary/Chest: WOB normal and breath sounds without rales or wheezing  Abdominal: Soft. Bowel sounds are normal. NT. No HSM  Musculoskeletal: Normal range of motion. Exhibits no edema Lymphadenopathy: Has no other cervical adenopathy.  Neurological: Pt is alert and oriented to person, place, and time. Pt has normal reflexes. No cranial nerve deficit. Motor grossly intact, Gait intact Skin: Skin is warm and dry. No rash noted or new ulcerations Psychiatric:  Has normal mood and affect. Behavior is normal without agitation No other exam findings  ECG today I have personally interpreted Sinus  Rhythm  -With rate variation     Assessment & Plan:

## 2017-01-06 NOTE — Assessment & Plan Note (Signed)
stable overall by history and exam, recent data reviewed with pt, and pt to continue medical treatment as before,  to f/u any worsening symptoms or concerns BP Readings from Last 3 Encounters:  01/04/17 138/78  07/04/16 138/78  06/27/16 (!) 170/78

## 2017-01-06 NOTE — Assessment & Plan Note (Signed)
stable overall by history and exam, recent data reviewed with pt, and pt to continue medical treatment as before,  to f/u any worsening symptoms or concerns Lab Results  Component Value Date   LDLCALC 54 01/04/2017   For fu lab

## 2017-01-06 NOTE — Assessment & Plan Note (Signed)
stable overall by history and exam, recent data reviewed with pt, and pt to continue medical treatment as before,  to f/u any worsening symptoms or concerns Lab Results  Component Value Date   HGBA1C 6.0 01/04/2017

## 2017-02-12 DIAGNOSIS — L820 Inflamed seborrheic keratosis: Secondary | ICD-10-CM | POA: Diagnosis not present

## 2017-03-19 DIAGNOSIS — M7702 Medial epicondylitis, left elbow: Secondary | ICD-10-CM | POA: Diagnosis not present

## 2017-03-19 DIAGNOSIS — M25512 Pain in left shoulder: Secondary | ICD-10-CM | POA: Diagnosis not present

## 2017-04-15 DIAGNOSIS — J449 Chronic obstructive pulmonary disease, unspecified: Secondary | ICD-10-CM | POA: Diagnosis not present

## 2017-04-15 DIAGNOSIS — J84112 Idiopathic pulmonary fibrosis: Secondary | ICD-10-CM | POA: Diagnosis not present

## 2017-04-20 DIAGNOSIS — R112 Nausea with vomiting, unspecified: Secondary | ICD-10-CM | POA: Diagnosis not present

## 2017-04-20 DIAGNOSIS — H8110 Benign paroxysmal vertigo, unspecified ear: Secondary | ICD-10-CM | POA: Diagnosis not present

## 2017-04-20 DIAGNOSIS — R1110 Vomiting, unspecified: Secondary | ICD-10-CM | POA: Diagnosis not present

## 2017-04-20 DIAGNOSIS — R079 Chest pain, unspecified: Secondary | ICD-10-CM | POA: Diagnosis not present

## 2017-04-20 DIAGNOSIS — R339 Retention of urine, unspecified: Secondary | ICD-10-CM | POA: Diagnosis not present

## 2017-04-20 DIAGNOSIS — R42 Dizziness and giddiness: Secondary | ICD-10-CM | POA: Diagnosis not present

## 2017-04-20 DIAGNOSIS — R509 Fever, unspecified: Secondary | ICD-10-CM | POA: Diagnosis not present

## 2017-04-25 DIAGNOSIS — R339 Retention of urine, unspecified: Secondary | ICD-10-CM | POA: Diagnosis not present

## 2017-04-25 DIAGNOSIS — N401 Enlarged prostate with lower urinary tract symptoms: Secondary | ICD-10-CM | POA: Diagnosis not present

## 2017-04-30 DIAGNOSIS — N319 Neuromuscular dysfunction of bladder, unspecified: Secondary | ICD-10-CM | POA: Diagnosis not present

## 2017-04-30 DIAGNOSIS — R339 Retention of urine, unspecified: Secondary | ICD-10-CM | POA: Diagnosis not present

## 2017-04-30 DIAGNOSIS — N401 Enlarged prostate with lower urinary tract symptoms: Secondary | ICD-10-CM | POA: Diagnosis not present

## 2017-05-07 ENCOUNTER — Ambulatory Visit (INDEPENDENT_AMBULATORY_CARE_PROVIDER_SITE_OTHER): Payer: Medicare Other | Admitting: Internal Medicine

## 2017-05-07 ENCOUNTER — Encounter: Payer: Self-pay | Admitting: Internal Medicine

## 2017-05-07 VITALS — BP 128/68 | HR 64 | Ht 71.0 in | Wt 172.0 lb

## 2017-05-07 DIAGNOSIS — Z125 Encounter for screening for malignant neoplasm of prostate: Secondary | ICD-10-CM | POA: Diagnosis not present

## 2017-05-07 DIAGNOSIS — R42 Dizziness and giddiness: Secondary | ICD-10-CM | POA: Diagnosis not present

## 2017-05-07 DIAGNOSIS — I10 Essential (primary) hypertension: Secondary | ICD-10-CM

## 2017-05-07 DIAGNOSIS — R946 Abnormal results of thyroid function studies: Secondary | ICD-10-CM | POA: Diagnosis not present

## 2017-05-07 DIAGNOSIS — R339 Retention of urine, unspecified: Secondary | ICD-10-CM | POA: Diagnosis not present

## 2017-05-07 DIAGNOSIS — N401 Enlarged prostate with lower urinary tract symptoms: Secondary | ICD-10-CM | POA: Diagnosis not present

## 2017-05-07 DIAGNOSIS — R7989 Other specified abnormal findings of blood chemistry: Secondary | ICD-10-CM | POA: Insufficient documentation

## 2017-05-07 MED ORDER — SILDENAFIL CITRATE 100 MG PO TABS: 50.0000 mg | ORAL_TABLET | Freq: Every day | ORAL | 3 refills | Status: DC | PRN

## 2017-05-07 MED ORDER — SILODOSIN 4 MG PO CAPS: 4.0000 mg | ORAL_CAPSULE | Freq: Every day | ORAL | 3 refills | Status: DC

## 2017-05-07 MED ORDER — MECLIZINE HCL 12.5 MG PO TABS: 12.5000 mg | ORAL_TABLET | Freq: Three times a day (TID) | ORAL | 5 refills | Status: DC | PRN

## 2017-05-07 NOTE — Progress Notes (Signed)
   Subjective:    Patient ID: Calvin Pugh, male    DOB: [birth year], 81 y.o.   MRN: [id number]  HPI  Here to f/u with wife, in f/u ED visit after 2 wks onset room spinning with n/v and dry heaves most of the first night to start then just spinning after, worse with movement and position change, seen at [facility] ED with blood work and CT head neg for stroke.  Tx with meclizine and now overall improved and essentially nearly resolved symptoms.   Has a remote hx of similar years ago.  Asks for PT/vestibular rehab  Also asks for TSH repeat due to slightly abnormal last value. Denies hyper or hypo thyroid symptoms such as voice, skin or hair change.  Pt denies chest pain, increased sob or doe, wheezing, orthopnea, PND, increased LE swelling, palpitations, dizziness or syncope.   Lab Results  Component Value Date   TSH 5.39 (H) 01/04/2017    Review of Systems  Constitutional: Negative for other unusual diaphoresis or sweats HENT: Negative for ear discharge or swelling Eyes: Negative for other worsening visual disturbances Respiratory: Negative for stridor or other swelling  Gastrointestinal: Negative for worsening distension or other blood Genitourinary: Negative for retention or other urinary change Musculoskeletal: Negative for other MSK pain or swelling Skin: Negative for color change or other new lesions Neurological: Negative for worsening tremors and other numbness  Psychiatric/Behavioral: Negative for worsening agitation or other fatigue All other system neg per pt    Objective:   Physical Exam BP 128/68   Pulse 64   Ht 5\' 11"  (1.803 m)   Wt 172 lb (78 kg)   SpO2 98%   BMI 23.99 kg/m  VS noted,  Constitutional: Pt appears in NAD HENT: Head: NCAT.  Right Ear: External ear normal.  Left Ear: External ear normal.  Eyes: . Pupils are equal, round, and reactive to light. Conjunctivae and EOM are normal Bilat tm's with mild erythema.  Max sinus areas non tender.  Pharynx with  mild erythema, no exudate Nose: without d/c or deformity Neck: Neck supple. Gross normal ROM Cardiovascular: Normal rate and regular rhythm.   Pulmonary/Chest: Effort normal and breath sounds without rales or wheezing.  Abd:  Soft, NT, ND, + BS, no organomegaly Neurological: Pt is alert. At baseline orientation, motor 5/5 intact, cn 2-12 intact, no nystagmus Skin: Skin is warm. No rashes, other new lesions, no LE edema Psychiatric: Pt behavior is normal without agitation  No other exam findings  Lab Results  Component Value Date   WBC 10.7 (H) 01/04/2017   HGB 15.5 01/04/2017   HCT 45.6 01/04/2017   PLT 212.0 01/04/2017   GLUCOSE 111 (H) 01/04/2017   CHOL 116 01/04/2017   TRIG 54.0 01/04/2017   HDL 51.00 01/04/2017   LDLCALC 54 01/04/2017   ALT 27 01/04/2017   AST 23 01/04/2017   NA 138 01/04/2017   K 5.1 01/04/2017   CL 101 01/04/2017   CREATININE 1.06 01/04/2017   BUN 18 01/04/2017   CO2 31 01/04/2017   TSH 5.39 (H) 01/04/2017   PSA 0.56 01/04/2017   HGBA1C 6.0 01/04/2017       Assessment & Plan:

## 2017-05-07 NOTE — Patient Instructions (Signed)
Please continue all other medications as before, and refills have been done if requested - including the meclizine for dizziness  You will be contacted regarding the referral for: [facility]  Please have the pharmacy call with any other refills you may need.  Please continue your efforts at being more active, low cholesterol diet, and weight control.  Please keep your appointments with your specialists as you may have planned  Please go to the LAB in the Basement (turn left off the elevator) for the tests to be done at your convenience  You will be contacted by phone if any changes need to be made immediately.  Otherwise, you will receive a letter about your results with an explanation, but please check with MyChart first.  Please remember to sign up for MyChart if you have not done so, as this will be important to you in the future with finding out test results, communicating by private email, and scheduling acute appointments online when needed.

## 2017-05-09 ENCOUNTER — Other Ambulatory Visit (INDEPENDENT_AMBULATORY_CARE_PROVIDER_SITE_OTHER): Payer: Medicare Other

## 2017-05-09 ENCOUNTER — Encounter: Payer: Self-pay | Admitting: Internal Medicine

## 2017-05-09 DIAGNOSIS — R7989 Other specified abnormal findings of blood chemistry: Secondary | ICD-10-CM

## 2017-05-09 DIAGNOSIS — R946 Abnormal results of thyroid function studies: Secondary | ICD-10-CM

## 2017-05-09 LAB — T4, FREE: Free T4: 1.28 ng/dL (ref 0.60–1.60)

## 2017-05-09 LAB — TSH: TSH: 2.71 u[IU]/mL (ref 0.35–4.50)

## 2017-05-09 NOTE — Assessment & Plan Note (Signed)
stable overall by history and exam, recent data reviewed with pt, and pt to continue medical treatment as before,  to f/u any worsening symptoms or concerns BP Readings from Last 3 Encounters:  05/07/17 128/68  01/04/17 138/78  07/04/16 138/78

## 2017-05-09 NOTE — Assessment & Plan Note (Signed)
Asympt, ok for repeat thyroid testing per pt request as last TSH abnormal

## 2017-05-09 NOTE — Assessment & Plan Note (Signed)
Improved, for cont'd meclizine prn, and referral PT - vesitubular rehab

## 2017-05-14 DIAGNOSIS — R42 Dizziness and giddiness: Secondary | ICD-10-CM | POA: Diagnosis not present

## 2017-05-22 DIAGNOSIS — R42 Dizziness and giddiness: Secondary | ICD-10-CM | POA: Diagnosis not present

## 2017-06-19 DIAGNOSIS — N401 Enlarged prostate with lower urinary tract symptoms: Secondary | ICD-10-CM | POA: Diagnosis not present

## 2017-06-19 DIAGNOSIS — R972 Elevated prostate specific antigen [PSA]: Secondary | ICD-10-CM | POA: Diagnosis not present

## 2017-06-19 DIAGNOSIS — R339 Retention of urine, unspecified: Secondary | ICD-10-CM | POA: Diagnosis not present

## 2017-07-31 DIAGNOSIS — N401 Enlarged prostate with lower urinary tract symptoms: Secondary | ICD-10-CM | POA: Diagnosis not present

## 2017-07-31 DIAGNOSIS — R339 Retention of urine, unspecified: Secondary | ICD-10-CM | POA: Diagnosis not present

## 2017-07-31 DIAGNOSIS — N319 Neuromuscular dysfunction of bladder, unspecified: Secondary | ICD-10-CM | POA: Diagnosis not present

## 2017-08-15 DIAGNOSIS — L820 Inflamed seborrheic keratosis: Secondary | ICD-10-CM | POA: Diagnosis not present

## 2017-10-30 DIAGNOSIS — N401 Enlarged prostate with lower urinary tract symptoms: Secondary | ICD-10-CM | POA: Diagnosis not present

## 2017-10-30 DIAGNOSIS — N481 Balanitis: Secondary | ICD-10-CM | POA: Diagnosis not present

## 2017-10-30 DIAGNOSIS — R972 Elevated prostate specific antigen [PSA]: Secondary | ICD-10-CM | POA: Diagnosis not present

## 2017-11-05 DIAGNOSIS — L820 Inflamed seborrheic keratosis: Secondary | ICD-10-CM | POA: Diagnosis not present

## 2018-01-07 ENCOUNTER — Encounter: Payer: Self-pay | Admitting: Internal Medicine

## 2018-01-07 ENCOUNTER — Ambulatory Visit (INDEPENDENT_AMBULATORY_CARE_PROVIDER_SITE_OTHER): Payer: Medicare Other | Admitting: Internal Medicine

## 2018-01-07 ENCOUNTER — Other Ambulatory Visit (INDEPENDENT_AMBULATORY_CARE_PROVIDER_SITE_OTHER): Payer: Medicare Other

## 2018-01-07 VITALS — BP 124/78 | HR 80 | Temp 97.9°F | Ht 71.0 in | Wt 180.0 lb

## 2018-01-07 DIAGNOSIS — E785 Hyperlipidemia, unspecified: Secondary | ICD-10-CM

## 2018-01-07 DIAGNOSIS — N320 Bladder-neck obstruction: Secondary | ICD-10-CM

## 2018-01-07 DIAGNOSIS — I10 Essential (primary) hypertension: Secondary | ICD-10-CM

## 2018-01-07 DIAGNOSIS — R7302 Impaired glucose tolerance (oral): Secondary | ICD-10-CM

## 2018-01-07 LAB — CBC WITH DIFFERENTIAL/PLATELET
BASOS PCT: 0.9 % (ref 0.0–3.0)
Basophils Absolute: 0.1 10*3/uL (ref 0.0–0.1)
Eosinophils Absolute: 0.4 10*3/uL (ref 0.0–0.7)
Eosinophils Relative: 5.3 % — ABNORMAL HIGH (ref 0.0–5.0)
HEMATOCRIT: 45.2 % (ref 39.0–52.0)
Hemoglobin: 15.4 g/dL (ref 13.0–17.0)
LYMPHS ABS: 1.2 10*3/uL (ref 0.7–4.0)
LYMPHS PCT: 17.4 % (ref 12.0–46.0)
MCHC: 34.1 g/dL (ref 30.0–36.0)
MCV: 89.7 fl (ref 78.0–100.0)
MONOS PCT: 11.5 % (ref 3.0–12.0)
Monocytes Absolute: 0.8 10*3/uL (ref 0.1–1.0)
NEUTROS ABS: 4.4 10*3/uL (ref 1.4–7.7)
Neutrophils Relative %: 64.9 % (ref 43.0–77.0)
PLATELETS: 201 10*3/uL (ref 150.0–400.0)
RBC: 5.04 Mil/uL (ref 4.22–5.81)
RDW: 14.8 % (ref 11.5–15.5)
WBC: 6.7 10*3/uL (ref 4.0–10.5)

## 2018-01-07 LAB — HEPATIC FUNCTION PANEL
ALT: 19 U/L (ref 0–53)
AST: 21 U/L (ref 0–37)
Albumin: 3.9 g/dL (ref 3.5–5.2)
Alkaline Phosphatase: 81 U/L (ref 39–117)
BILIRUBIN DIRECT: 0.2 mg/dL (ref 0.0–0.3)
BILIRUBIN TOTAL: 0.7 mg/dL (ref 0.2–1.2)
Total Protein: 7.4 g/dL (ref 6.0–8.3)

## 2018-01-07 LAB — LIPID PANEL
Cholesterol: 110 mg/dL (ref 0–200)
HDL: 49.7 mg/dL (ref >39.00)
LDL CALC: 49 mg/dL (ref 0–99)
NONHDL: 60.7
Total CHOL/HDL Ratio: 2
Triglycerides: 60 mg/dL (ref 0.0–149.0)
VLDL: 12 mg/dL (ref 0.0–40.0)

## 2018-01-07 LAB — BASIC METABOLIC PANEL
BUN: 16 mg/dL (ref 6–23)
CO2: 30 meq/L (ref 19–32)
Calcium: 9.2 mg/dL (ref 8.4–10.5)
Chloride: 102 mEq/L (ref 96–112)
Creatinine, Ser: 1.1 mg/dL (ref 0.40–1.50)
GFR: 68.56 mL/min (ref >60.00)
GLUCOSE: 106 mg/dL — AB (ref 70–99)
POTASSIUM: 4.9 meq/L (ref 3.5–5.1)
Sodium: 137 mEq/L (ref 135–145)

## 2018-01-07 LAB — URINALYSIS, ROUTINE W REFLEX MICROSCOPIC
BILIRUBIN URINE: NEGATIVE
Hgb urine dipstick: NEGATIVE
Ketones, ur: NEGATIVE
LEUKOCYTES UA: NEGATIVE
Nitrite: NEGATIVE
PH: 7 (ref 5.0–8.0)
RBC / HPF: NONE SEEN (ref 0–?)
Specific Gravity, Urine: 1.01 (ref 1.000–1.030)
TOTAL PROTEIN, URINE-UPE24: NEGATIVE
URINE GLUCOSE: NEGATIVE
Urobilinogen, UA: 0.2 (ref 0.0–1.0)

## 2018-01-07 LAB — HEMOGLOBIN A1C: HEMOGLOBIN A1C: 6 % (ref 4.6–6.5)

## 2018-01-07 LAB — PSA: PSA: 1.02 ng/mL (ref 0.10–4.00)

## 2018-01-07 LAB — TSH: TSH: 4.93 u[IU]/mL — ABNORMAL HIGH (ref 0.35–4.50)

## 2018-01-07 MED ORDER — FLUTICASONE PROPIONATE 50 MCG/ACT NA SUSP: 2.0000 | Freq: Every day | NASAL | 3 refills | Status: DC

## 2018-01-07 MED ORDER — ALBUTEROL SULFATE HFA 108 (90 BASE) MCG/ACT IN AERS: 1.0000 | INHALATION_SPRAY | Freq: Four times a day (QID) | RESPIRATORY_TRACT | 3 refills | Status: DC | PRN

## 2018-01-07 MED ORDER — ZOSTER VAC RECOMB ADJUVANTED 50 MCG/0.5ML IM SUSR: 0.5000 mL | Freq: Once | INTRAMUSCULAR | 1 refills | Status: AC

## 2018-01-07 MED ORDER — AMLODIPINE BESYLATE 5 MG PO TABS: 5.0000 mg | ORAL_TABLET | Freq: Every day | ORAL | 3 refills | Status: DC

## 2018-01-07 MED ORDER — PANTOPRAZOLE SODIUM 40 MG PO TBEC: 40.0000 mg | DELAYED_RELEASE_TABLET | Freq: Every day | ORAL | 3 refills | Status: DC

## 2018-01-07 MED ORDER — MELOXICAM 15 MG PO TABS: 15.0000 mg | ORAL_TABLET | Freq: Every day | ORAL | 3 refills | Status: DC

## 2018-01-07 MED ORDER — SILDENAFIL CITRATE 100 MG PO TABS: 50.0000 mg | ORAL_TABLET | Freq: Every day | ORAL | 3 refills | Status: DC | PRN

## 2018-01-07 MED ORDER — BETAMETHASONE VALERATE 0.1 % EX CREA: TOPICAL_CREAM | Freq: Two times a day (BID) | CUTANEOUS | 3 refills | Status: DC

## 2018-01-07 MED ORDER — ATORVASTATIN CALCIUM 20 MG PO TABS: 20.0000 mg | ORAL_TABLET | Freq: Every day | ORAL | 3 refills | Status: DC

## 2018-01-07 MED ORDER — FLUTICASONE-SALMETEROL 250-50 MCG/DOSE IN AEPB: 1.0000 | INHALATION_SPRAY | Freq: Two times a day (BID) | RESPIRATORY_TRACT | 3 refills | Status: DC

## 2018-01-07 NOTE — Progress Notes (Signed)
Subjective:    Patient ID: Calvin Pugh, male    DOB: 06-11-1936, 82 y.o.   MRN: [id number]  HPI  Here to f/u; overall doing ok,  Pt denies chest pain, increasing sob or doe, wheezing, orthopnea, PND, increased LE swelling, palpitations, dizziness or syncope.  Pt denies new neurological symptoms such as new headache, or facial or extremity weakness or numbness.  Pt denies polydipsia, polyuria, or low sugar episode.  Pt states overall good compliance with meds, mostly trying to follow appropriate diet, with wt overall stable,  but little exercise however.  No interval hx or new complaints Past Medical History:  Diagnosis Date  . ALLERGIC RHINITIS 12/08/2007  . ARTHRITIS 07/25/2007  . BENIGN PROSTATIC HYPERTROPHY, HX OF 07/25/2007  . CATARACTS, BILATERAL, HX OF 07/25/2007  . COPD 09/09/2009  . CORONARY ARTERY DISEASE 07/25/2007  . DEGENERATIVE JOINT DISEASE, CERVICAL SPINE 07/25/2007  . DIVERTICULOSIS, COLON 12/08/2007  . DIVERTICULOSIS, COLON, HX OF 07/25/2007  . DIZZINESS 09/08/2009  . FATIGUE 12/08/2007  . GERD 07/25/2007  . GLUCOSE INTOLERANCE 12/08/2007  . HYPERLIPIDEMIA 07/25/2007  . HYPERTENSION 07/25/2007  . Lower back pain 12/17/2011  . Other acquired absence of organ 07/25/2007  . Other postprocedural status(V45.89) 07/25/2007  . PERIPHERAL VASCULAR DISEASE 12/08/2007  . Pulmonary fibrosis ([place]) 07/20/2014  . Solitary pulmonary nodule 09/29/2010  . Wheezing 09/29/2010   Past Surgical History:  Procedure Laterality Date  . APPENDECTOMY    . CARPAL TUNNEL RELEASE    . CATARACT EXTRACTION    . CHOLECYSTECTOMY  2015    hospital  . HEMORRHOID SURGERY    . rotator cuff repair right    . TONSILLECTOMY      reports that he has quit smoking. He has never used smokeless tobacco. He reports that he drinks alcohol. He reports that he does not use drugs. family history includes Allergic rhinitis in his son; COPD in his father; Cancer in his father; Heart disease in his mother;  Hypertension in his sister. Allergies  Allergen Reactions  . Codeine     REACTION: causes nausea  . Isosorbide Mononitrate     REACTION: severe h/a   Current Outpatient Medications on File Prior to Visit  Medication Sig Dispense Refill  . aspirin 81 MG tablet Take 1 tablet (81 mg total) by mouth daily. 30 tablet 11  . Biotin 5000 MCG TABS Take by mouth.    . EPINEPHrine (EPIPEN 2-PAK) 0.3 mg/0.3 mL IJ SOAJ injection Inject 0.3 mg into the muscle once.    . meclizine (ANTIVERT) 12.5 MG tablet Take 1 tablet (12.5 mg total) by mouth 3 (three) times daily as needed. 30 tablet 5  . Tiotropium Bromide Monohydrate (SPIRIVA RESPIMAT) 2.5 MCG/ACT AERS Inhale into the lungs.     No current facility-administered medications on file prior to visit.    Review of Systems  Constitutional: Negative for other unusual diaphoresis or sweats HENT: Negative for ear discharge or swelling Eyes: Negative for other worsening visual disturbances Respiratory: Negative for stridor or other swelling  Gastrointestinal: Negative for worsening distension or other blood Genitourinary: Negative for retention or other urinary change Musculoskeletal: Negative for other MSK pain or swelling Skin: Negative for color change or other new lesions Neurological: Negative for worsening tremors and other numbness  Psychiatric/Behavioral: Negative for worsening agitation or other fatigue All other system neg per pt    Objective:   Physical Exam BP 124/78   Pulse 80   Temp 97.9 F (36.6 C) (Oral)  Ht 5\' 11"  (1.803 m)   Wt 180 lb (81.6 kg)   SpO2 96%   BMI 25.10 kg/m  VS noted,  Constitutional: Pt appears in NAD HENT: Head: NCAT.  Right Ear: External ear normal.  Left Ear: External ear normal.  Eyes: . Pupils are equal, round, and reactive to light. Conjunctivae and EOM are normal Nose: without d/c or deformity Neck: Neck supple. Gross normal ROM Cardiovascular: Normal rate and regular rhythm.   Pulmonary/Chest:  Effort normal and breath sounds without rales or wheezing.  Abd:  Soft, NT, ND, + BS, no organomegaly Neurological: Pt is alert. At baseline orientation, motor grossly intact Skin: Skin is warm. No rashes, other new lesions, no LE edema Psychiatric: Pt behavior is normal without agitation  No other exam findings     Assessment & Plan:

## 2018-01-07 NOTE — Patient Instructions (Signed)

## 2018-01-08 NOTE — Assessment & Plan Note (Signed)
stable overall by history and exam, recent data reviewed with pt, and pt to continue medical treatment as before,  to f/u any worsening symptoms or concerns BP Readings from Last 3 Encounters:  01/07/18 124/78  05/07/17 128/68  01/04/17 138/78

## 2018-01-08 NOTE — Assessment & Plan Note (Signed)
Also for psa as he is due, asympt

## 2018-01-08 NOTE — Assessment & Plan Note (Signed)
Lab Results  Component Value Date   HGBA1C 6.0 01/07/2018  stable overall by history and exam, recent data reviewed with pt, and pt to continue medical treatment as before,  to f/u any worsening symptoms or concerns

## 2018-01-08 NOTE — Assessment & Plan Note (Signed)
Lab Results  Component Value Date   LDLCALC 49 01/07/2018   stable overall by history and exam, recent data reviewed with pt, and pt to continue medical treatment as before,  to f/u any worsening symptoms or concerns

## 2018-03-11 DIAGNOSIS — L820 Inflamed seborrheic keratosis: Secondary | ICD-10-CM | POA: Diagnosis not present

## 2018-06-05 ENCOUNTER — Other Ambulatory Visit: Payer: Self-pay | Admitting: Internal Medicine

## 2018-06-05 DIAGNOSIS — G8929 Other chronic pain: Secondary | ICD-10-CM

## 2018-06-05 DIAGNOSIS — M549 Dorsalgia, unspecified: Principal | ICD-10-CM

## 2018-06-12 ENCOUNTER — Telehealth: Payer: Self-pay | Admitting: Internal Medicine

## 2018-06-12 NOTE — Telephone Encounter (Signed)
Copied from [place] [id number]. Topic: Quick Communication - See Telephone Encounter >> Jun 12, 2018 10:47 AM Neva Seat wrote: Pt called the office he asked to be referred to.  The office has not received his referral.   Please call pt back to let him know the status of his referral.   Dr. Fredna Dow - [facility]. - Pain Management in [place], [place] - phone [phone] - fax

## 2018-06-13 ENCOUNTER — Ambulatory Visit (INDEPENDENT_AMBULATORY_CARE_PROVIDER_SITE_OTHER): Payer: Medicare Other | Admitting: Internal Medicine

## 2018-06-13 ENCOUNTER — Encounter: Payer: Self-pay | Admitting: Internal Medicine

## 2018-06-13 VITALS — BP 120/76 | HR 83 | Temp 97.9°F | Ht 71.0 in | Wt 181.0 lb

## 2018-06-13 DIAGNOSIS — G8929 Other chronic pain: Secondary | ICD-10-CM

## 2018-06-13 DIAGNOSIS — R7302 Impaired glucose tolerance (oral): Secondary | ICD-10-CM

## 2018-06-13 DIAGNOSIS — M545 Low back pain: Secondary | ICD-10-CM | POA: Diagnosis not present

## 2018-06-13 DIAGNOSIS — I10 Essential (primary) hypertension: Secondary | ICD-10-CM | POA: Diagnosis not present

## 2018-06-13 DIAGNOSIS — Z23 Encounter for immunization: Secondary | ICD-10-CM

## 2018-06-13 MED ORDER — LIDOCAINE 5 % EX PTCH: 1.0000 | MEDICATED_PATCH | CUTANEOUS | 1 refills | Status: DC

## 2018-06-13 NOTE — Patient Instructions (Signed)
Please take all new medication as prescribed - the pain patch  Please continue all other medications as before, and refills have been done if requested.  Please have the pharmacy call with any other refills you may need.  Please keep your appointments with your specialists as you may have planned  You will be contacted regarding the referral for: pain management as you requested

## 2018-06-13 NOTE — Progress Notes (Signed)
Subjective:    Patient ID: Calvin Pugh, male    DOB: 1936/04/15, 82 y.o.   MRN: [id number]  HPI  Here to f/u; overall doing ok,  Pt denies chest pain, increasing sob or doe, wheezing, orthopnea, PND, increased LE swelling, palpitations, dizziness or syncope.  Pt denies new neurological symptoms such as new headache, or facial or extremity weakness or numbness.  Pt denies polydipsia, polyuria, or low sugar episode.  Pt states overall good compliance with meds, mostly trying to follow appropriate diet, with wt overall stable,  but little exercise however due to chronic pain, wondering about his referral situation.  Does not want specific tx today, only wants to see pain management in [place], to which he was referred earlier this month.  Pt continues to have recurring LBP without change in severity, bowel or bladder change, fever, wt loss,  worsening LE pain/numbness/weakness, gait change or falls. Past Medical History:  Diagnosis Date  . ALLERGIC RHINITIS 12/08/2007  . ARTHRITIS 07/25/2007  . BENIGN PROSTATIC HYPERTROPHY, HX OF 07/25/2007  . CATARACTS, BILATERAL, HX OF 07/25/2007  . COPD 09/09/2009  . CORONARY ARTERY DISEASE 07/25/2007  . DEGENERATIVE JOINT DISEASE, CERVICAL SPINE 07/25/2007  . DIVERTICULOSIS, COLON 12/08/2007  . DIVERTICULOSIS, COLON, HX OF 07/25/2007  . DIZZINESS 09/08/2009  . FATIGUE 12/08/2007  . GERD 07/25/2007  . GLUCOSE INTOLERANCE 12/08/2007  . HYPERLIPIDEMIA 07/25/2007  . HYPERTENSION 07/25/2007  . Lower back pain 12/17/2011  . Other acquired absence of organ 07/25/2007  . Other postprocedural status(V45.89) 07/25/2007  . PERIPHERAL VASCULAR DISEASE 12/08/2007  . Pulmonary fibrosis ([place]) 07/20/2014  . Solitary pulmonary nodule 09/29/2010  . Wheezing 09/29/2010   Past Surgical History:  Procedure Laterality Date  . APPENDECTOMY    . CARPAL TUNNEL RELEASE    . CATARACT EXTRACTION    . CHOLECYSTECTOMY  2015   [place] [facility]  . HEMORRHOID SURGERY    .  rotator cuff repair right    . TONSILLECTOMY      reports that he has quit smoking. He has never used smokeless tobacco. He reports that he drinks alcohol. He reports that he does not use drugs. family history includes Allergic rhinitis in his son; COPD in his father; Cancer in his father; Heart disease in his mother; Hypertension in his sister. Allergies  Allergen Reactions  . Codeine     REACTION: causes nausea  . Isosorbide Mononitrate     REACTION: severe h/a   Current Outpatient Medications on File Prior to Visit  Medication Sig Dispense Refill  . albuterol (PROVENTIL HFA;VENTOLIN HFA) 108 (90 Base) MCG/ACT inhaler Inhale 1 puff into the lungs every 6 (six) hours as needed for wheezing or shortness of breath. 48 g 3  . amLODipine (NORVASC) 5 MG tablet Take 1 tablet (5 mg total) by mouth daily. 90 tablet 3  . aspirin 81 MG tablet Take 1 tablet (81 mg total) by mouth daily. 30 tablet 11  . atorvastatin (LIPITOR) 20 MG tablet Take 1 tablet (20 mg total) by mouth daily. 90 tablet 3  . betamethasone valerate (VALISONE) 0.1 % cream Apply topically 2 (two) times daily. 45 g 3  . Biotin 5000 MCG TABS Take by mouth.    . EPINEPHrine (EPIPEN 2-PAK) 0.3 mg/0.3 mL IJ SOAJ injection Inject 0.3 mg into the muscle once.    . fluticasone (FLONASE) 50 MCG/ACT nasal spray Place 2 sprays into both nostrils daily. 48 g 3  . Fluticasone-Salmeterol (ADVAIR) 250-50 MCG/DOSE AEPB Inhale 1 puff into  the lungs 2 (two) times daily. 180 each 3  . meclizine (ANTIVERT) 12.5 MG tablet Take 1 tablet (12.5 mg total) by mouth 3 (three) times daily as needed. 30 tablet 5  . meloxicam (MOBIC) 15 MG tablet Take 1 tablet (15 mg total) by mouth daily. 90 tablet 3  . pantoprazole (PROTONIX) 40 MG tablet Take 1 tablet (40 mg total) by mouth daily. 90 tablet 3  . sildenafil (VIAGRA) 100 MG tablet Take 0.5-1 tablets (50-100 mg total) by mouth daily as needed for erectile dysfunction. 24 tablet 3  . Tiotropium Bromide  Monohydrate (SPIRIVA RESPIMAT) 2.5 MCG/ACT AERS Inhale into the lungs.     No current facility-administered medications on file prior to visit.    Review of Systems  Constitutional: Negative for other unusual diaphoresis or sweats HENT: Negative for ear discharge or swelling Eyes: Negative for other worsening visual disturbances Respiratory: Negative for stridor or other swelling  Gastrointestinal: Negative for worsening distension or other blood Genitourinary: Negative for retention or other urinary change Musculoskeletal: Negative for other MSK pain or swelling Skin: Negative for color change or other new lesions Neurological: Negative for worsening tremors and other numbness  Psychiatric/Behavioral: Negative for worsening agitation or other fatigue All other system neg per pt    Objective:   Physical Exam BP 120/76   Pulse 83   Temp 97.9 F (36.6 C) (Oral)   Ht 5\' 11"  (1.803 m)   Wt 181 lb (82.1 kg)   SpO2 94%   BMI 25.24 kg/m  VS noted,  Constitutional: Pt appears in NAD HENT: Head: NCAT.  Right Ear: External ear normal.  Left Ear: External ear normal.  Eyes: . Pupils are equal, round, and reactive to light. Conjunctivae and EOM are normal Nose: without d/c or deformity Neck: Neck supple. Gross normal ROM Cardiovascular: Normal rate and regular rhythm.   Pulmonary/Chest: Effort normal and breath sounds without rales or wheezing.  Abd:  Soft, NT, ND, + BS, no organomegaly Spine: diffuse lumbar tender midline without swelling or rash Neurological: Pt is alert. At baseline orientation, motor grossly intact, gait somewhat slow Skin: Skin is warm. No rashes, other new lesions, no LE edema Psychiatric: Pt behavior is normal without agitation , mild nervous No other exam findings    Assessment & Plan:

## 2018-06-14 NOTE — Assessment & Plan Note (Signed)
stable overall by history and exam, recent data reviewed with pt, and pt to continue medical treatment as before,  to f/u any worsening symptoms or concerns  

## 2018-06-14 NOTE — Assessment & Plan Note (Signed)
D/w pt, pt should hear about the referral soon, declines change in tx for now

## 2018-06-23 DIAGNOSIS — Z79891 Long term (current) use of opiate analgesic: Secondary | ICD-10-CM | POA: Diagnosis not present

## 2018-06-23 DIAGNOSIS — M5416 Radiculopathy, lumbar region: Secondary | ICD-10-CM | POA: Diagnosis not present

## 2018-06-23 DIAGNOSIS — Z5181 Encounter for therapeutic drug level monitoring: Secondary | ICD-10-CM | POA: Diagnosis not present

## 2018-06-27 DIAGNOSIS — M545 Low back pain: Secondary | ICD-10-CM | POA: Diagnosis not present

## 2018-06-27 DIAGNOSIS — M5416 Radiculopathy, lumbar region: Secondary | ICD-10-CM | POA: Diagnosis not present

## 2018-07-10 DIAGNOSIS — J8410 Pulmonary fibrosis, unspecified: Secondary | ICD-10-CM | POA: Diagnosis not present

## 2018-07-10 DIAGNOSIS — J449 Chronic obstructive pulmonary disease, unspecified: Secondary | ICD-10-CM | POA: Diagnosis not present

## 2018-07-14 DIAGNOSIS — M5136 Other intervertebral disc degeneration, lumbar region: Secondary | ICD-10-CM | POA: Diagnosis not present

## 2018-07-14 DIAGNOSIS — M5416 Radiculopathy, lumbar region: Secondary | ICD-10-CM | POA: Diagnosis not present

## 2018-07-15 DIAGNOSIS — D225 Melanocytic nevi of trunk: Secondary | ICD-10-CM | POA: Diagnosis not present

## 2018-07-15 DIAGNOSIS — L820 Inflamed seborrheic keratosis: Secondary | ICD-10-CM | POA: Diagnosis not present

## 2018-07-16 DIAGNOSIS — J439 Emphysema, unspecified: Secondary | ICD-10-CM | POA: Diagnosis not present

## 2018-07-16 DIAGNOSIS — J84112 Idiopathic pulmonary fibrosis: Secondary | ICD-10-CM | POA: Diagnosis not present

## 2018-08-01 DIAGNOSIS — J449 Chronic obstructive pulmonary disease, unspecified: Secondary | ICD-10-CM | POA: Diagnosis not present

## 2018-08-01 DIAGNOSIS — R5383 Other fatigue: Secondary | ICD-10-CM | POA: Diagnosis not present

## 2018-08-01 DIAGNOSIS — J8410 Pulmonary fibrosis, unspecified: Secondary | ICD-10-CM | POA: Diagnosis not present

## 2018-09-01 DIAGNOSIS — M5416 Radiculopathy, lumbar region: Secondary | ICD-10-CM | POA: Diagnosis not present

## 2018-09-05 DIAGNOSIS — J449 Chronic obstructive pulmonary disease, unspecified: Secondary | ICD-10-CM | POA: Diagnosis not present

## 2018-09-05 DIAGNOSIS — J8410 Pulmonary fibrosis, unspecified: Secondary | ICD-10-CM | POA: Diagnosis not present

## 2018-09-09 DIAGNOSIS — M5136 Other intervertebral disc degeneration, lumbar region: Secondary | ICD-10-CM | POA: Diagnosis not present

## 2018-09-18 DIAGNOSIS — J8410 Pulmonary fibrosis, unspecified: Secondary | ICD-10-CM | POA: Diagnosis not present

## 2018-10-21 DIAGNOSIS — J8410 Pulmonary fibrosis, unspecified: Secondary | ICD-10-CM | POA: Diagnosis not present

## 2018-10-21 DIAGNOSIS — J449 Chronic obstructive pulmonary disease, unspecified: Secondary | ICD-10-CM | POA: Diagnosis not present

## 2019-01-07 ENCOUNTER — Telehealth: Payer: Self-pay

## 2019-01-07 NOTE — Telephone Encounter (Signed)
Phone visit encounter for documentation annual/CPE appointment on 4/9 @ 9.20am

## 2019-01-08 ENCOUNTER — Telehealth (INDEPENDENT_AMBULATORY_CARE_PROVIDER_SITE_OTHER): Payer: Medicare Other | Admitting: Internal Medicine

## 2019-01-08 ENCOUNTER — Ambulatory Visit: Payer: Medicare Other | Admitting: Internal Medicine

## 2019-01-08 DIAGNOSIS — J438 Other emphysema: Secondary | ICD-10-CM

## 2019-01-08 DIAGNOSIS — J449 Chronic obstructive pulmonary disease, unspecified: Secondary | ICD-10-CM

## 2019-01-08 DIAGNOSIS — E785 Hyperlipidemia, unspecified: Secondary | ICD-10-CM | POA: Diagnosis not present

## 2019-01-08 DIAGNOSIS — R7302 Impaired glucose tolerance (oral): Secondary | ICD-10-CM

## 2019-01-08 DIAGNOSIS — I10 Essential (primary) hypertension: Secondary | ICD-10-CM

## 2019-01-08 DIAGNOSIS — R739 Hyperglycemia, unspecified: Secondary | ICD-10-CM

## 2019-01-08 MED ORDER — PANTOPRAZOLE SODIUM 40 MG PO TBEC: 40.0000 mg | DELAYED_RELEASE_TABLET | Freq: Every day | ORAL | 3 refills | Status: DC

## 2019-01-08 MED ORDER — MELOXICAM 15 MG PO TABS: 15.0000 mg | ORAL_TABLET | Freq: Every day | ORAL | 3 refills | Status: DC

## 2019-01-08 MED ORDER — SILDENAFIL CITRATE 100 MG PO TABS: 50.0000 mg | ORAL_TABLET | Freq: Every day | ORAL | 3 refills | Status: DC | PRN

## 2019-01-08 MED ORDER — ATORVASTATIN CALCIUM 20 MG PO TABS: 20.0000 mg | ORAL_TABLET | Freq: Every day | ORAL | 3 refills | Status: DC

## 2019-01-08 MED ORDER — ALBUTEROL SULFATE HFA 108 (90 BASE) MCG/ACT IN AERS: 1.0000 | INHALATION_SPRAY | Freq: Four times a day (QID) | RESPIRATORY_TRACT | 3 refills | Status: DC | PRN

## 2019-01-08 MED ORDER — FLUTICASONE PROPIONATE 50 MCG/ACT NA SUSP: 2.0000 | Freq: Every day | NASAL | 3 refills | Status: DC

## 2019-01-08 MED ORDER — MECLIZINE HCL 25 MG PO TABS: 25.0000 mg | ORAL_TABLET | Freq: Three times a day (TID) | ORAL | 3 refills | Status: DC | PRN

## 2019-01-08 MED ORDER — AMLODIPINE BESYLATE 5 MG PO TABS
5.0000 mg | ORAL_TABLET | Freq: Every day | ORAL | 3 refills | Status: DC
Start: 2019-01-08 — End: 2019-05-19

## 2019-01-08 MED ORDER — FLUTICASONE-UMECLIDIN-VILANT 100-62.5-25 MCG/INH IN AEPB: 1.0000 | INHALATION_SPRAY | Freq: Every day | RESPIRATORY_TRACT | 3 refills | Status: DC

## 2019-01-08 NOTE — Telephone Encounter (Signed)
Cumulative time during 7-day interval 19 min , there was not an associated office visit for this concern within a 7 day period.  Verbal consent for services obtained from patient prior to services given.  Names of all persons present for services: Cathlean Cower, MD, and patient  Chief complaint: yearly f/u  History, Here for yearly f/u by phone due to pandemic;  Overall doing ok;  Pt denies Chest pain, worsening SOB, DOE, wheezing, orthopnea, PND, worsening LE edema, palpitations, dizziness or syncope.  Pt denies neurological change such as new headache, facial or extremity weakness.  Pt denies polydipsia, polyuria, or low sugar symptoms. Pt states overall good compliance with treatment and medications, good tolerability, and has been trying to follow appropriate diet.  Pt denies worsening depressive symptoms, suicidal ideation or panic. No fever, night sweats, wt loss, loss of appetite, or other constitutional symptoms.  Pt states good ability with ADL's, has low fall risk, home safety reviewed and adequate, no other significant changes in hearing or vision, and not active with exercise.  Due for labs:  Past Medical History:  Diagnosis Date  . ALLERGIC RHINITIS 12/08/2007  . ARTHRITIS 07/25/2007  . BENIGN PROSTATIC HYPERTROPHY, HX OF 07/25/2007  . CATARACTS, BILATERAL, HX OF 07/25/2007  . COPD 09/09/2009  . CORONARY ARTERY DISEASE 07/25/2007  . DEGENERATIVE JOINT DISEASE, CERVICAL SPINE 07/25/2007  . DIVERTICULOSIS, COLON 12/08/2007  . DIVERTICULOSIS, COLON, HX OF 07/25/2007  . DIZZINESS 09/08/2009  . FATIGUE 12/08/2007  . GERD 07/25/2007  . GLUCOSE INTOLERANCE 12/08/2007  . HYPERLIPIDEMIA 07/25/2007  . HYPERTENSION 07/25/2007  . Lower back pain 12/17/2011  . Other acquired absence of organ 07/25/2007  . Other postprocedural status(V45.89) 07/25/2007  . PERIPHERAL VASCULAR DISEASE 12/08/2007  . Pulmonary fibrosis ([place]) 07/20/2014  . Solitary pulmonary nodule 09/29/2010  . Wheezing 09/29/2010    ROS: Non contributory  Observations/ Data Lab Results  Component Value Date   WBC 6.7 01/07/2018   HGB 15.4 01/07/2018   HCT 45.2 01/07/2018   PLT 201.0 01/07/2018   GLUCOSE 106 (H) 01/07/2018   CHOL 110 01/07/2018   TRIG 60.0 01/07/2018   HDL 49.70 01/07/2018   LDLCALC 49 01/07/2018   ALT 19 01/07/2018   AST 21 01/07/2018   NA 137 01/07/2018   K 4.9 01/07/2018   CL 102 01/07/2018   CREATININE 1.10 01/07/2018   BUN 16 01/07/2018   CO2 30 01/07/2018   TSH 4.93 (H) 01/07/2018   PSA 1.02 01/07/2018   HGBA1C 6.0 01/07/2018   A/P/next steps  COPD - now on new trelegy, stable overall by history and exam, recent data reviewed with pt, and pt to continue medical treatment as before,  to f/u any worsening symptoms or concerns  Hyperglycemia - stable overall by history and exam, recent data reviewed with pt, and pt to continue medical treatment as before,  to f/u any worsening symptoms or concerns, for a1c with labs - to be ordered at [facility] in [place] - stable overall by history and exam, recent data reviewed with pt, and pt to continue medical treatment as before,  to f/u any worsening symptoms or concerns, for lipids with labs  HTN - urged to check BP on regular basis  Cathlean Cower

## 2019-01-09 ENCOUNTER — Ambulatory Visit: Payer: Medicare Other | Admitting: Internal Medicine

## 2019-01-12 ENCOUNTER — Telehealth: Payer: Self-pay

## 2019-01-12 NOTE — Telephone Encounter (Signed)
Pt has been informed that lab form he would need to take to [facility] was mailed out to his home along with his paper prescriptions that her requested. Pt expressed understanding.   Copied from [place] [id number]. Topic: Quick Communication - See Telephone Encounter >> Jan 08, 2019  4:42 PM Nils Flack wrote: CRM for notification. See Telephone encounter for: 01/08/19. Pt was told that provider would put in order for labs to be drawn at [facility] in [place]. Please call pt if any questions [phone]

## 2019-01-13 DIAGNOSIS — L820 Inflamed seborrheic keratosis: Secondary | ICD-10-CM | POA: Diagnosis not present

## 2019-01-13 DIAGNOSIS — D485 Neoplasm of uncertain behavior of skin: Secondary | ICD-10-CM | POA: Diagnosis not present

## 2019-01-16 ENCOUNTER — Other Ambulatory Visit: Payer: Self-pay | Admitting: Internal Medicine

## 2019-01-16 DIAGNOSIS — R7302 Impaired glucose tolerance (oral): Secondary | ICD-10-CM | POA: Diagnosis not present

## 2019-01-16 DIAGNOSIS — E785 Hyperlipidemia, unspecified: Secondary | ICD-10-CM | POA: Diagnosis not present

## 2019-01-17 LAB — CBC/DIFF AMBIGUOUS DEFAULT
Basophils Absolute: 0.1 10*3/uL (ref 0.0–0.2)
Basos: 1 %
EOS (ABSOLUTE): 0.4 10*3/uL (ref 0.0–0.4)
Eos: 5 %
Hematocrit: 47.3 % (ref 37.5–51.0)
Hemoglobin: 16.4 g/dL (ref 13.0–17.7)
Lymphocytes Absolute: 1.7 10*3/uL (ref 0.7–3.1)
Lymphs: 22 %
MCH: 31.5 pg (ref 26.6–33.0)
MCHC: 34.7 g/dL (ref 31.5–35.7)
MCV: 91 fL (ref 79–97)
Monocytes Absolute: 0.8 10*3/uL (ref 0.1–0.9)
Monocytes: 10 %
Neutrophils Absolute: 4.8 10*3/uL (ref 1.4–7.0)
Neutrophils: 62 %
Platelets: 200 10*3/uL (ref 150–450)
RBC: 5.2 x10E6/uL (ref 4.14–5.80)
RDW: 14.2 % (ref 11.6–15.4)
WBC: 7.7 10*3/uL (ref 3.4–10.8)

## 2019-01-17 LAB — URINALYSIS, ROUTINE W REFLEX MICROSCOPIC
Bilirubin, UA: NEGATIVE
Glucose, UA: NEGATIVE
Ketones, UA: NEGATIVE
Leukocytes,UA: NEGATIVE
Nitrite, UA: NEGATIVE
Protein,UA: NEGATIVE
RBC, UA: NEGATIVE
Specific Gravity, UA: 1.02 (ref 1.005–1.030)
Urobilinogen, Ur: 0.2 mg/dL (ref 0.2–1.0)
pH, UA: 5.5 (ref 5.0–7.5)

## 2019-01-17 LAB — COMPREHENSIVE METABOLIC PANEL
ALT: 18 IU/L (ref 0–44)
AST: 28 IU/L (ref 0–40)
Albumin/Globulin Ratio: 1.5 (ref 1.2–2.2)
Albumin: 4.1 g/dL (ref 3.7–4.7)
Alkaline Phosphatase: 100 IU/L (ref 39–117)
BUN/Creatinine Ratio: 19 (ref 10–24)
BUN: 20 mg/dL (ref 8–27)
Bilirubin Total: 0.8 mg/dL (ref 0.0–1.2)
CO2: 24 mmol/L (ref 20–29)
Calcium: 9.8 mg/dL (ref 8.6–10.2)
Chloride: 101 mmol/L (ref 96–106)
Creatinine, Ser: 1.05 mg/dL (ref 0.76–1.27)
GFR calc Af Amer: 77 mL/min/{1.73_m2} (ref >59)
GFR calc non Af Amer: 67 mL/min/{1.73_m2} (ref >59)
Globulin, Total: 2.8 g/dL (ref 1.5–4.5)
Glucose: 100 mg/dL — ABNORMAL HIGH (ref 65–99)
Potassium: 5.3 mmol/L — ABNORMAL HIGH (ref 3.5–5.2)
Sodium: 135 mmol/L (ref 134–144)
Total Protein: 6.9 g/dL (ref 6.0–8.5)

## 2019-01-17 LAB — TSH: TSH: 5.19 u[IU]/mL — ABNORMAL HIGH (ref 0.450–4.500)

## 2019-01-17 LAB — LIPID PANEL W/O CHOL/HDL RATIO
Cholesterol, Total: 103 mg/dL (ref 100–199)
HDL: 47 mg/dL (ref >39)
LDL Calculated: 42 mg/dL (ref 0–99)
Triglycerides: 69 mg/dL (ref 0–149)
VLDL Cholesterol Cal: 14 mg/dL (ref 5–40)

## 2019-01-17 LAB — AMBIG ABBREV EP DEFAULT

## 2019-01-17 LAB — HGB A1C W/O EAG: Hgb A1c MFr Bld: 5.9 % — ABNORMAL HIGH (ref 4.8–5.6)

## 2019-01-19 ENCOUNTER — Encounter: Payer: Self-pay | Admitting: Internal Medicine

## 2019-01-19 ENCOUNTER — Other Ambulatory Visit: Payer: Self-pay | Admitting: Internal Medicine

## 2019-01-19 MED ORDER — LEVOTHYROXINE SODIUM 25 MCG PO TABS: 25.0000 ug | ORAL_TABLET | Freq: Every day | ORAL | 3 refills | Status: DC

## 2019-01-20 ENCOUNTER — Telehealth: Payer: Self-pay

## 2019-01-20 DIAGNOSIS — J8410 Pulmonary fibrosis, unspecified: Secondary | ICD-10-CM | POA: Diagnosis not present

## 2019-01-20 DIAGNOSIS — J449 Chronic obstructive pulmonary disease, unspecified: Secondary | ICD-10-CM | POA: Diagnosis not present

## 2019-01-20 NOTE — Telephone Encounter (Signed)
Pt has been informed of results and expressed understanding.  °

## 2019-01-20 NOTE — Telephone Encounter (Signed)
-----   Message from Biagio Borg, MD sent at 01/19/2019  5:25 PM EDT ----- Left message on MyChart, pt to cont same tx except  The test results show that your current treatment is OK, as the tests were stable, except there is a thyroid test which shows some mild slowing.  We should start a very low dose medication called levothyroxine 25 mcg per day.  I will send the prescription, and you should hear from the office as well.Redmond Baseman to please inform pt, I will do rx

## 2019-04-22 DIAGNOSIS — R0600 Dyspnea, unspecified: Secondary | ICD-10-CM | POA: Diagnosis not present

## 2019-04-22 DIAGNOSIS — J449 Chronic obstructive pulmonary disease, unspecified: Secondary | ICD-10-CM | POA: Diagnosis not present

## 2019-04-22 DIAGNOSIS — J8410 Pulmonary fibrosis, unspecified: Secondary | ICD-10-CM | POA: Diagnosis not present

## 2019-04-22 DIAGNOSIS — R5383 Other fatigue: Secondary | ICD-10-CM | POA: Diagnosis not present

## 2019-04-22 DIAGNOSIS — J479 Bronchiectasis, uncomplicated: Secondary | ICD-10-CM | POA: Diagnosis not present

## 2019-04-28 DIAGNOSIS — R042 Hemoptysis: Secondary | ICD-10-CM | POA: Diagnosis not present

## 2019-04-28 DIAGNOSIS — J849 Interstitial pulmonary disease, unspecified: Secondary | ICD-10-CM | POA: Diagnosis not present

## 2019-04-28 DIAGNOSIS — J432 Centrilobular emphysema: Secondary | ICD-10-CM | POA: Diagnosis not present

## 2019-04-28 DIAGNOSIS — J90 Pleural effusion, not elsewhere classified: Secondary | ICD-10-CM | POA: Diagnosis not present

## 2019-04-28 DIAGNOSIS — J9809 Other diseases of bronchus, not elsewhere classified: Secondary | ICD-10-CM | POA: Diagnosis not present

## 2019-05-06 DIAGNOSIS — J449 Chronic obstructive pulmonary disease, unspecified: Secondary | ICD-10-CM | POA: Diagnosis not present

## 2019-05-06 DIAGNOSIS — J471 Bronchiectasis with (acute) exacerbation: Secondary | ICD-10-CM | POA: Diagnosis not present

## 2019-05-06 DIAGNOSIS — J84112 Idiopathic pulmonary fibrosis: Secondary | ICD-10-CM | POA: Diagnosis not present

## 2019-05-06 DIAGNOSIS — J8410 Pulmonary fibrosis, unspecified: Secondary | ICD-10-CM | POA: Diagnosis not present

## 2019-05-19 ENCOUNTER — Telehealth: Payer: Self-pay | Admitting: Emergency Medicine

## 2019-05-19 MED ORDER — TRELEGY ELLIPTA 100-62.5-25 MCG/INH IN AEPB: 1.0000 | INHALATION_SPRAY | Freq: Every day | RESPIRATORY_TRACT | 2 refills | Status: AC

## 2019-05-19 MED ORDER — MELOXICAM 15 MG PO TABS: 15.0000 mg | ORAL_TABLET | Freq: Every day | ORAL | 2 refills | Status: DC

## 2019-05-19 MED ORDER — MECLIZINE HCL 25 MG PO TABS: 25.0000 mg | ORAL_TABLET | Freq: Three times a day (TID) | ORAL | 2 refills | Status: DC | PRN

## 2019-05-19 MED ORDER — PANTOPRAZOLE SODIUM 40 MG PO TBEC: 40.0000 mg | DELAYED_RELEASE_TABLET | Freq: Every day | ORAL | 2 refills | Status: AC

## 2019-05-19 MED ORDER — ALBUTEROL SULFATE HFA 108 (90 BASE) MCG/ACT IN AERS: 1.0000 | INHALATION_SPRAY | Freq: Four times a day (QID) | RESPIRATORY_TRACT | 2 refills | Status: AC | PRN

## 2019-05-19 MED ORDER — FLUTICASONE PROPIONATE 50 MCG/ACT NA SUSP: 2.0000 | Freq: Every day | NASAL | 2 refills | Status: AC

## 2019-05-19 MED ORDER — SILDENAFIL CITRATE 100 MG PO TABS: 50.0000 mg | ORAL_TABLET | Freq: Every day | ORAL | 1 refills | Status: AC | PRN

## 2019-05-19 MED ORDER — AMLODIPINE BESYLATE 5 MG PO TABS: 5.0000 mg | ORAL_TABLET | Freq: Every day | ORAL | 2 refills | Status: DC

## 2019-05-19 MED ORDER — ATORVASTATIN CALCIUM 20 MG PO TABS: 20.0000 mg | ORAL_TABLET | Freq: Every day | ORAL | 2 refills | Status: AC

## 2019-05-19 MED ORDER — LEVOTHYROXINE SODIUM 25 MCG PO TABS: 25.0000 ug | ORAL_TABLET | Freq: Every day | ORAL | 2 refills | Status: AC

## 2019-05-19 NOTE — Telephone Encounter (Signed)
Pt called stating that [facility] has not received a response from a faxed rx request. [facility].

## 2019-05-20 DIAGNOSIS — J479 Bronchiectasis, uncomplicated: Secondary | ICD-10-CM | POA: Diagnosis not present

## 2019-05-20 DIAGNOSIS — J449 Chronic obstructive pulmonary disease, unspecified: Secondary | ICD-10-CM | POA: Diagnosis not present

## 2019-05-20 DIAGNOSIS — J8410 Pulmonary fibrosis, unspecified: Secondary | ICD-10-CM | POA: Diagnosis not present

## 2019-07-24 DIAGNOSIS — L820 Inflamed seborrheic keratosis: Secondary | ICD-10-CM | POA: Diagnosis not present

## 2019-07-28 DIAGNOSIS — J479 Bronchiectasis, uncomplicated: Secondary | ICD-10-CM | POA: Diagnosis not present

## 2019-07-28 DIAGNOSIS — J8410 Pulmonary fibrosis, unspecified: Secondary | ICD-10-CM | POA: Diagnosis not present

## 2019-07-28 DIAGNOSIS — Z23 Encounter for immunization: Secondary | ICD-10-CM | POA: Diagnosis not present

## 2019-07-28 DIAGNOSIS — R5383 Other fatigue: Secondary | ICD-10-CM | POA: Diagnosis not present

## 2019-07-28 DIAGNOSIS — J449 Chronic obstructive pulmonary disease, unspecified: Secondary | ICD-10-CM | POA: Diagnosis not present

## 2019-07-29 DIAGNOSIS — R5383 Other fatigue: Secondary | ICD-10-CM | POA: Diagnosis not present

## 2019-08-04 DIAGNOSIS — R0600 Dyspnea, unspecified: Secondary | ICD-10-CM | POA: Diagnosis not present

## 2019-08-04 DIAGNOSIS — I361 Nonrheumatic tricuspid (valve) insufficiency: Secondary | ICD-10-CM | POA: Diagnosis not present

## 2019-08-31 DIAGNOSIS — J449 Chronic obstructive pulmonary disease, unspecified: Secondary | ICD-10-CM | POA: Diagnosis not present

## 2019-08-31 DIAGNOSIS — R5383 Other fatigue: Secondary | ICD-10-CM | POA: Diagnosis not present

## 2019-08-31 DIAGNOSIS — J8410 Pulmonary fibrosis, unspecified: Secondary | ICD-10-CM | POA: Diagnosis not present

## 2019-08-31 DIAGNOSIS — J479 Bronchiectasis, uncomplicated: Secondary | ICD-10-CM | POA: Diagnosis not present

## 2019-09-10 ENCOUNTER — Ambulatory Visit (INDEPENDENT_AMBULATORY_CARE_PROVIDER_SITE_OTHER): Payer: Medicare Other | Admitting: Cardiology

## 2019-09-10 ENCOUNTER — Encounter: Payer: Self-pay | Admitting: Cardiology

## 2019-09-10 ENCOUNTER — Other Ambulatory Visit: Payer: Self-pay

## 2019-09-10 VITALS — BP 130/72 | HR 103 | Ht 71.0 in | Wt 167.0 lb

## 2019-09-10 DIAGNOSIS — R931 Abnormal findings on diagnostic imaging of heart and coronary circulation: Secondary | ICD-10-CM

## 2019-09-10 DIAGNOSIS — I10 Essential (primary) hypertension: Secondary | ICD-10-CM

## 2019-09-10 DIAGNOSIS — R0609 Other forms of dyspnea: Secondary | ICD-10-CM

## 2019-09-10 DIAGNOSIS — E782 Mixed hyperlipidemia: Secondary | ICD-10-CM | POA: Diagnosis not present

## 2019-09-10 DIAGNOSIS — R0600 Dyspnea, unspecified: Secondary | ICD-10-CM | POA: Diagnosis not present

## 2019-09-10 DIAGNOSIS — J431 Panlobular emphysema: Secondary | ICD-10-CM

## 2019-09-10 NOTE — Patient Instructions (Signed)
Medication Instructions:  Your physician recommends that you continue on your current medications as directed. Please refer to the Current Medication list given to you today.  *If you need a refill on your cardiac medications before your next appointment, please call your pharmacy*  Lab Work: None.  If you have labs (blood work) drawn today and your tests are completely normal, you will receive your results only by: Marland Kitchen MyChart Message (if you have MyChart) OR . A paper copy in the mail If you have any lab test that is abnormal or we need to change your treatment, we will call you to review the results.  Testing/Procedures: [facility] [address], [place], [place] [address] [phone]  Lexiscan testing instructions:  Please present to [facility] 15 minutes earlier than your appointment time to allow for registration.  You will be called with an appointment date and time by our office once it has been scheduled; please allow at least 48 hours for [place] to contact you.  No food or drink after midnight prior to your test (except for small sips of water with your medications).  Hold the following medications the morning of your test: Sildenafil 48 hours before   Bring a medication list or all your medications with you the morning of the testing.  No caffeine, decaffeinated or chocolate products 12 hours prior to the testing.  Please be aware that the test can take up to 3-4 hours.   Should you have any problem with the appointment date or time, please call [phone].   Please call the office with any further questions or concerns.    Follow-Up: At [facility], you and your health needs are our priority.  As part of our continuing mission to provide you with exceptional heart care, we have created designated Provider Care Teams.  These Care Teams include your primary Cardiologist (physician) and Advanced Practice Providers (APPs -  Physician  Assistants and Nurse Practitioners) who all work together to provide you with the care you need, when you need it.  Your next appointment:   1 month(s)  The format for your next appointment:   In Person  Provider:   Jyl Heinz, MD  Other Instructions   Cardiac Nuclear Scan A cardiac nuclear scan is a test that measures blood flow to the heart when a person is resting and when he or she is exercising. The test looks for problems such as:  Not enough blood reaching a portion of the heart.  The heart muscle not working normally. You may need this test if:  You have heart disease.  You have had abnormal lab results.  You have had heart surgery or a balloon procedure to open up blocked arteries (angioplasty).  You have chest pain.  You have shortness of breath. In this test, a radioactive dye (tracer) is injected into your bloodstream. After the tracer has traveled to your heart, an imaging device is used to measure how much of the tracer is absorbed by or distributed to various areas of your heart. This procedure is usually done at a hospital and takes 2-4 hours. Tell a health care provider about:  Any allergies you have.  All medicines you are taking, including vitamins, herbs, eye drops, creams, and over-the-counter medicines.  Any problems you or family members have had with anesthetic medicines.  Any blood disorders you have.  Any surgeries you have had.  Any medical conditions you have.  Whether you are pregnant or  may be pregnant. What are the risks? Generally, this is a safe procedure. However, problems may occur, including:  Serious chest pain and heart attack. This is only a risk if the stress portion of the test is done.  Rapid heartbeat.  Sensation of warmth in your chest. This usually passes quickly.  Allergic reaction to the tracer. What happens before the procedure?  Ask your health care provider about changing or stopping your regular  medicines. This is especially important if you are taking diabetes medicines or blood thinners.  Follow instructions from your health care provider about eating or drinking restrictions.  Remove your jewelry on the day of the procedure. What happens during the procedure?  An IV will be inserted into one of your veins.  Your health care provider will inject a small amount of radioactive tracer through the IV.  You will wait for 20-40 minutes while the tracer travels through your bloodstream.  Your heart activity will be monitored with an electrocardiogram (ECG).  You will lie down on an exam table.  Images of your heart will be taken for about 15-20 minutes.  You may also have a stress test. For this test, one of the following may be done: ? You will exercise on a treadmill or stationary bike. While you exercise, your heart's activity will be monitored with an ECG, and your blood pressure will be checked. ? You will be given medicines that will increase blood flow to parts of your heart. This is done if you are unable to exercise.  When blood flow to your heart has peaked, a tracer will again be injected through the IV.  After 20-40 minutes, you will get back on the exam table and have more images taken of your heart.  Depending on the type of tracer used, scans may need to be repeated 3-4 hours later.  Your IV line will be removed when the procedure is over. The procedure may vary among health care providers and hospitals. What happens after the procedure?  Unless your health care provider tells you otherwise, you may return to your normal schedule, including diet, activities, and medicines.  Unless your health care provider tells you otherwise, you may increase your fluid intake. This will help to flush the contrast dye from your body. Drink enough fluid to keep your urine pale yellow.  Ask your health care provider, or the department that is doing the test: ? When will my  results be ready? ? How will I get my results? Summary  A cardiac nuclear scan measures the blood flow to the heart when a person is resting and when he or she is exercising.  Tell your health care provider if you are pregnant.  Before the procedure, ask your health care provider about changing or stopping your regular medicines. This is especially important if you are taking diabetes medicines or blood thinners.  After the procedure, unless your health care provider tells you otherwise, increase your fluid intake. This will help flush the contrast dye from your body.  After the procedure, unless your health care provider tells you otherwise, you may return to your normal schedule, including diet, activities, and medicines. This information is not intended to replace advice given to you by your health care provider. Make sure you discuss any questions you have with your health care provider. Document Released: 10/12/2004 Document Revised: 03/03/2018 Document Reviewed: 03/03/2018 Elsevier Patient Education  2020 [employer].

## 2019-09-10 NOTE — Progress Notes (Signed)
Cardiology Office Note:    Date:  09/10/2019   ID:  Calvin Pugh, DOB 18-Jun-1936, MRN [id number]  PCP:  Biagio Borg, MD  Cardiologist:  Jenean Lindau, MD   Referring MD: Gardiner Rhyme, MD    ASSESSMENT:    1. Dyspnea on exertion   2. Abnormal echocardiogram   3. Mixed hyperlipidemia   4. Essential hypertension   5. Panlobular emphysema ([place])    PLAN:    In order of problems listed above:  1. Dyspnea on exertion: Patient has advanced pulmonary issues pulmonary fibrosis and 24-hour home oxygen.  A lot of the symptomatology must be compliant from these issues.  However in view of abnormal echocardiogram and risk factors for coronary artery disease I think it would be appropriate to have a Lexiscan sestamibi done at [facility] to look for any objective evidence of coronary artery disease.  I gave him the option of invasive and noninvasive evaluation and he prefers Lexiscan sestamibi and I respect his wishes.  Results of the echocardiogram were discussed with the patient at extensive length.  He is in close contact with his primary care and pulmonologist for pulmonary issues. 2. Patient will be seen in follow-up appointment in 2 months or earlier if the patient has any concerns    Medication Adjustments/Labs and Tests Ordered: Current medicines are reviewed at length with the patient today.  Concerns regarding medicines are outlined above.  Orders Placed This Encounter  Procedures   MYOCARDIAL PERFUSION IMAGING   EKG 12-Lead   No orders of the defined types were placed in this encounter.    History of Present Illness:    Calvin Pugh is a 83 y.o. male who is being seen today for the evaluation of dyspnea on exertion and abnormal echocardiogram at the request of Gardiner Rhyme, MD.  Patient is a pleasant 83 year old gentleman.  He has multiple chronic comorbidities including essential hypertension, dyslipidemia, pulmonary fibrosis.  He is on oxygen all the  time.  He is referred here because his echocardiogram will add wall motion abnormalities and was abnormal and he was sent here for evaluation for the same.  He denies any chest pain orthopnea or PND.  At the time of my evaluation, the patient is alert awake oriented and in no distress.  Past Medical History:  Diagnosis Date   ALLERGIC RHINITIS 12/08/2007   ARTHRITIS 07/25/2007   BENIGN PROSTATIC HYPERTROPHY, HX OF 07/25/2007   CATARACTS, BILATERAL, HX OF 07/25/2007   COPD 09/09/2009   CORONARY ARTERY DISEASE 07/25/2007   DEGENERATIVE JOINT DISEASE, CERVICAL SPINE 07/25/2007   DIVERTICULOSIS, COLON 12/08/2007   DIVERTICULOSIS, COLON, HX OF 07/25/2007   DIZZINESS 09/08/2009   FATIGUE 12/08/2007   GERD 07/25/2007   GLUCOSE INTOLERANCE 12/08/2007   HYPERLIPIDEMIA 07/25/2007   HYPERTENSION 07/25/2007   Lower back pain 12/17/2011   Other acquired absence of organ 07/25/2007   Other postprocedural status(V45.89) 07/25/2007   PERIPHERAL VASCULAR DISEASE 12/08/2007   Pulmonary fibrosis ([place]) 07/20/2014   Solitary pulmonary nodule 09/29/2010   Wheezing 09/29/2010    Past Surgical History:  Procedure Laterality Date   APPENDECTOMY     CARPAL TUNNEL RELEASE     CATARACT EXTRACTION     CHOLECYSTECTOMY  2015   [facility]   HEMORRHOID SURGERY     rotator cuff repair right     TONSILLECTOMY      Current Medications: Current Meds  Medication Sig   albuterol (VENTOLIN HFA) 108 (90 Base) MCG/ACT  inhaler Inhale 1 puff into the lungs every 6 (six) hours as needed for wheezing or shortness of breath.   amLODipine (NORVASC) 5 MG tablet Take 1 tablet (5 mg total) by mouth daily.   aspirin 81 MG tablet Take 1 tablet (81 mg total) by mouth daily.   atorvastatin (LIPITOR) 20 MG tablet Take 1 tablet (20 mg total) by mouth daily.   Biotin 5000 MCG TABS Take by mouth.   EPINEPHrine (EPIPEN 2-PAK) 0.3 mg/0.3 mL IJ SOAJ injection Inject 0.3 mg into the muscle once.    fluticasone (FLONASE) 50 MCG/ACT nasal spray Place 2 sprays into both nostrils daily.   Fluticasone-Umeclidin-Vilant (TRELEGY ELLIPTA) 100-62.5-25 MCG/INH AEPB Inhale 1 spray into the lungs daily.   levothyroxine (SYNTHROID) 25 MCG tablet Take 1 tablet (25 mcg total) by mouth daily before breakfast.   Nintedanib (OFEV) 150 MG CAPS Take 1 capsule by mouth daily.   pantoprazole (PROTONIX) 40 MG tablet Take 1 tablet (40 mg total) by mouth daily.   sildenafil (VIAGRA) 100 MG tablet Take 0.5-1 tablets (50-100 mg total) by mouth daily as needed for erectile dysfunction.     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  Tobacco Use   Smoking status: Former Smoker   Smokeless tobacco: Never Used  Substance and Sexual Activity   Alcohol use: Yes   Drug use: No   Sexual activity: Not on file  Other Topics Concern   Not on file  Social History Narrative   Not on file   Social Determinants of Health   Financial Resource Strain:    Difficulty of Paying Living Expenses: Not on file  Food Insecurity:    Worried About Charity fundraiser in the Last Year: Not on file   Saline in the Last Year: Not on file  Transportation Needs:    Lack of Transportation (Medical): Not on file   Lack of Transportation (Non-Medical): Not on file  Physical Activity:    Days of Exercise per Week: Not on file   Minutes of Exercise per Session: Not on file  Stress:    Feeling of Stress : Not on file  Social Connections:    Frequency of Communication with Friends and Family: Not on file   Frequency of Social Gatherings with Friends and Family: Not on file    Attends Religious Services: Not on file   Active Member of Clubs or Organizations: Not on file   Attends Archivist Meetings: Not on file   Marital Status: Not on file     Family History: The patient's family history includes Allergic rhinitis in his son; COPD in his father; Cancer in his father; Heart disease in his mother; Hypertension in his sister.  ROS:   Please see the history of present illness.    All other systems reviewed and are negative.  EKGs/Labs/Other Studies Reviewed:    The following studies were reviewed today: EKG reveals sinus tachycardia and nonspecific ST-T changes   Recent Labs: 01/16/2019: ALT 18; BUN 20; Creatinine, Ser 1.05; Hemoglobin 16.4; Platelets 200; Potassium 5.3; Sodium 135; TSH 5.190  Recent Lipid Panel  Component Value Date/Time   CHOL 103 01/16/2019 1232   TRIG 69 01/16/2019 1232   HDL 47 01/16/2019 1232   CHOLHDL 2 01/07/2018 1032   VLDL 12.0 01/07/2018 1032   LDLCALC 42 01/16/2019 1232    Physical Exam:    VS:  BP 130/72    Pulse (!) 103    Ht 5\' 11"  (1.803 m)    Wt 167 lb (75.8 kg)    SpO2 90% Comment: 3lt O2   BMI 23.29 kg/m     Wt Readings from Last 3 Encounters:  09/10/19 167 lb (75.8 kg)  06/13/18 181 lb (82.1 kg)  01/07/18 180 lb (81.6 kg)     GEN: Patient is in no acute distress HEENT: Normal NECK: No JVD; No carotid bruits LYMPHATICS: No lymphadenopathy CARDIAC: S1 S2 regular, 2/6 systolic murmur at the apex. RESPIRATORY:  Clear to auscultation without rales, wheezing or rhonchi  ABDOMEN: Soft, non-tender, non-distended MUSCULOSKELETAL:  No edema; No deformity  SKIN: Warm and dry NEUROLOGIC:  Alert and oriented x 3 PSYCHIATRIC:  Normal affect    Signed, Jenean Lindau, MD  09/10/2019 1:45 PM    [facility]

## 2019-09-14 DIAGNOSIS — R931 Abnormal findings on diagnostic imaging of heart and coronary circulation: Secondary | ICD-10-CM | POA: Diagnosis not present

## 2019-09-14 DIAGNOSIS — R0600 Dyspnea, unspecified: Secondary | ICD-10-CM | POA: Diagnosis not present

## 2019-09-15 ENCOUNTER — Telehealth: Payer: Self-pay | Admitting: Emergency Medicine

## 2019-09-15 NOTE — Telephone Encounter (Signed)
Called patient as he requested a call back per front desk. He is upset because he had to wait at [facility] for a new order to be faxed over while he was there for a test. I gave the order form to the front desk staff, Janine Limbo. For unknown reasons [facility] didn't receive it. I spent some time trying to figure out where the form was. Ended up having to get a new form together and send that. I apologized and explained to the patient why there was a delay. No further questions.

## 2019-09-23 ENCOUNTER — Telehealth: Payer: Self-pay

## 2019-09-23 NOTE — Telephone Encounter (Signed)
Patient informed that recent lexi results from [facility] were abnormal and Dr. Docia Furl will discuss further in appt on 10/09/19.

## 2019-10-09 ENCOUNTER — Ambulatory Visit: Payer: Medicare Other | Admitting: Cardiology

## 2019-10-12 ENCOUNTER — Ambulatory Visit (INDEPENDENT_AMBULATORY_CARE_PROVIDER_SITE_OTHER): Payer: Medicare Other | Admitting: Cardiology

## 2019-10-12 ENCOUNTER — Other Ambulatory Visit: Payer: Self-pay

## 2019-10-12 ENCOUNTER — Encounter: Payer: Self-pay | Admitting: Cardiology

## 2019-10-12 VITALS — BP 128/78 | HR 124 | Ht 71.0 in | Wt 170.0 lb

## 2019-10-12 DIAGNOSIS — R7989 Other specified abnormal findings of blood chemistry: Secondary | ICD-10-CM

## 2019-10-12 DIAGNOSIS — Z9229 Personal history of other drug therapy: Secondary | ICD-10-CM

## 2019-10-12 DIAGNOSIS — I10 Essential (primary) hypertension: Secondary | ICD-10-CM | POA: Diagnosis not present

## 2019-10-12 DIAGNOSIS — I499 Cardiac arrhythmia, unspecified: Secondary | ICD-10-CM

## 2019-10-12 DIAGNOSIS — J8410 Pulmonary fibrosis, unspecified: Secondary | ICD-10-CM

## 2019-10-12 DIAGNOSIS — I4892 Unspecified atrial flutter: Secondary | ICD-10-CM | POA: Insufficient documentation

## 2019-10-12 DIAGNOSIS — I429 Cardiomyopathy, unspecified: Secondary | ICD-10-CM | POA: Insufficient documentation

## 2019-10-12 MED ORDER — METOPROLOL SUCCINATE ER 50 MG PO TB24: 50.0000 mg | ORAL_TABLET | Freq: Every day | ORAL | 4 refills | Status: DC

## 2019-10-12 MED ORDER — APIXABAN 5 MG PO TABS: 5.0000 mg | ORAL_TABLET | Freq: Two times a day (BID) | ORAL | 3 refills | Status: AC

## 2019-10-12 NOTE — Progress Notes (Signed)
Cardiology Office Note:    Date:  10/12/2019   ID:  Calvin Pugh, DOB 08-27-1936, MRN [id number]  PCP:  Biagio Borg, MD  Cardiologist:  Jenean Lindau, MD   Referring MD: Biagio Borg, MD    ASSESSMENT:    1. Essential hypertension   2. Irregular heart beat   3. Abnormal TSH   4. History of anticoagulant therapy   5. Cardiomyopathy, unspecified type ([place])   6. Atrial flutter, unspecified type ([place])   7. Pulmonary fibrosis ([place])    PLAN:    In order of problems listed above:  1. Dyspnea on exertion: Stress test reports were discussed with the patient at extensive length.  In view of new EKG findings suggesting of atrial flutter I will initiate him on anticoagulation.  We will do a stool check for occult blood.  Benefits and potential risks of this medication was explained to the patient and the patient vocalized understanding and questions were answered to satisfaction.  We will do a TSH since he is on thyroid supplementation.  Also results of stress test and echo were detailed to him at extensive length.  In view of elevated heart rate have stopped his amlodipine and initiate him on metoprolol succinate 50 mg daily.  He will be back in 2 weeks for a follow-up appointment.  Patient had multiple questions which were answered to his satisfaction.  He knows to go to the nearest emergency room for any concerning symptoms.  In view of recent evaluation and multiple defects abnormalities on echo and stress test I told him to consider evaluation with coronary angiography but is not keen on it.  Benefits and potential risks explained and I respect his wishes.  He mentions to me that he has multiple comorbidities including advanced pulmonary fibrosis and therefore he prefers medical therapy.   Medication Adjustments/Labs and Tests Ordered: Current medicines are reviewed at length with the patient today.  Concerns regarding medicines are outlined above.  Orders Placed This Encounter    Procedures  . Fecal occult blood, imunochemical([facility])  . TSH  . EKG 12-Lead   Meds ordered this encounter  Medications  . apixaban (ELIQUIS) 5 MG TABS tablet    Sig: Take 1 tablet (5 mg total) by mouth 2 (two) times daily.    Dispense:  60 tablet    Refill:  3  . metoprolol succinate (TOPROL XL) 50 MG 24 hr tablet    Sig: Take 1 tablet (50 mg total) by mouth daily. Take with or immediately following a meal.    Dispense:  30 tablet    Refill:  4     Chief Complaint  Patient presents with  . Follow-up     History of Present Illness:    Calvin Pugh is a 84 y.o. male.  Patient was evaluated by me for dyspnea on exertion.  He has pulmonary fibrosis with advanced pulmonary disease.  He is stress test revealed an ejection fraction of 34% with possibility of infarct in the inferior and anteroseptal section.  Echocardiogram revealed ejection fraction of 45 to 50% with inferoseptal and basal akinesia and mid anteroseptal akinesis.  Subsequently patient has been fairly well.  He complains of shortness of breath on exertion.  He is EKG done today reveals atrial tachycardia and a suggestion of atrial flutter with variable conduction.  Patient complains of increasing shortness of breath on exertion.  At the time of my evaluation, the patient is alert awake oriented  and in no distress.  His resting heart rate is 124.  Past Medical History:  Diagnosis Date  . ALLERGIC RHINITIS 12/08/2007  . ARTHRITIS 07/25/2007  . BENIGN PROSTATIC HYPERTROPHY, HX OF 07/25/2007  . CATARACTS, BILATERAL, HX OF 07/25/2007  . COPD 09/09/2009  . CORONARY ARTERY DISEASE 07/25/2007  . DEGENERATIVE JOINT DISEASE, CERVICAL SPINE 07/25/2007  . DIVERTICULOSIS, COLON 12/08/2007  . DIVERTICULOSIS, COLON, HX OF 07/25/2007  . DIZZINESS 09/08/2009  . FATIGUE 12/08/2007  . GERD 07/25/2007  . GLUCOSE INTOLERANCE 12/08/2007  . HYPERLIPIDEMIA 07/25/2007  . HYPERTENSION 07/25/2007  . Lower back pain 12/17/2011  .  Other acquired absence of organ 07/25/2007  . Other postprocedural status(V45.89) 07/25/2007  . PERIPHERAL VASCULAR DISEASE 12/08/2007  . Pulmonary fibrosis ([place]) 07/20/2014  . Solitary pulmonary nodule 09/29/2010  . Wheezing 09/29/2010    Past Surgical History:  Procedure Laterality Date  . APPENDECTOMY    . CARPAL TUNNEL RELEASE    . CATARACT EXTRACTION    . CHOLECYSTECTOMY  2015   [place] [facility]  . HEMORRHOID SURGERY    . rotator cuff repair right    . TONSILLECTOMY      Current Medications: Current Meds  Medication Sig  . albuterol (VENTOLIN HFA) 108 (90 Base) MCG/ACT inhaler Inhale 1 puff into the lungs every 6 (six) hours as needed for wheezing or shortness of breath.  Marland Kitchen aspirin 81 MG tablet Take 1 tablet (81 mg total) by mouth daily.  Marland Kitchen atorvastatin (LIPITOR) 20 MG tablet Take 1 tablet (20 mg total) by mouth daily.  . Biotin 5000 MCG TABS Take by mouth.  . EPINEPHrine (EPIPEN 2-PAK) 0.3 mg/0.3 mL IJ SOAJ injection Inject 0.3 mg into the muscle once.  . fluticasone (FLONASE) 50 MCG/ACT nasal spray Place 2 sprays into both nostrils daily.  . Fluticasone-Umeclidin-Vilant (TRELEGY ELLIPTA) 100-62.5-25 MCG/INH AEPB Inhale 1 spray into the lungs daily.  Marland Kitchen levothyroxine (SYNTHROID) 25 MCG tablet Take 1 tablet (25 mcg total) by mouth daily before breakfast.  . Nintedanib (OFEV) 150 MG CAPS Take 1 capsule by mouth daily.  . pantoprazole (PROTONIX) 40 MG tablet Take 1 tablet (40 mg total) by mouth daily.  . sildenafil (VIAGRA) 100 MG tablet Take 0.5-1 tablets (50-100 mg total) by mouth daily as needed for erectile dysfunction.  . [DISCONTINUED] amLODipine (NORVASC) 5 MG tablet Take 1 tablet (5 mg total) by mouth daily.     Allergies:   Codeine and Isosorbide mononitrate   Social History   Socioeconomic History  . Marital status: Married    Spouse name: Not on file  . Number of children: Not on file  . Years of education: Not on file  . Highest education level: Not on file    Occupational History  . 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 Smoking status: Former Research scientist (life sciences)  . Smokeless tobacco: Never Used  Substance and Sexual Activity  . Alcohol use: Yes  . Drug use: No  . Sexual activity: Not on file  Other Topics Concern  . Not on file  Social History Narrative  . Not on file   Social Determinants of Health   Financial Resource Strain:   . Difficulty of Paying Living Expenses: Not on file  Food Insecurity:   . Worried About Charity fundraiser in the Last Year: Not on file  . Ran Out of Food in the Last Year: Not on file  Transportation Needs:   . Lack of  Transportation (Medical): Not on file  . Lack of Transportation (Non-Medical): Not on file  Physical Activity:   . Days of Exercise per Week: Not on file  . Minutes of Exercise per Session: Not on file  Stress:   . Feeling of Stress : Not on file  Social Connections:   . Frequency of Communication with Friends and Family: Not on file  . Frequency of Social Gatherings with Friends and Family: Not on file  . Attends Religious Services: Not on file  . Active Member of Clubs or Organizations: Not on file  . Attends Archivist Meetings: Not on file  . Marital Status: Not on file     Family History: The patient's family history includes Allergic rhinitis in his son; COPD in his father; Cancer in his father; Heart disease in his mother; Hypertension in his sister.  ROS:   Please see the history of present illness.    All other systems reviewed and are negative.  EKGs/Labs/Other Studies Reviewed:    The following studies were reviewed today: EKG suggests atrial flutter with variable conduction.   Recent Labs: 01/16/2019: ALT 18; BUN 20; Creatinine, Ser 1.05; Hemoglobin 16.4; Platelets 200; Potassium 5.3; Sodium 135; TSH 5.190  Recent Lipid Panel    Component Value Date/Time   CHOL 103 01/16/2019 1232   TRIG 69 01/16/2019  1232   HDL 47 01/16/2019 1232   CHOLHDL 2 01/07/2018 1032   VLDL 12.0 01/07/2018 1032   LDLCALC 42 01/16/2019 1232    Physical Exam:    VS:  BP 128/78 (BP Location: Left Arm, Patient Position: Sitting, Cuff Size: Normal)   Pulse (!) 124   Ht 5\' 11"  (1.803 m)   Wt 170 lb (77.1 kg)   SpO2 90%   BMI 23.71 kg/m     Wt Readings from Last 3 Encounters:  10/12/19 170 lb (77.1 kg)  09/10/19 167 lb (75.8 kg)  06/13/18 181 lb (82.1 kg)     GEN: Patient is in no acute distress HEENT: Normal NECK: No JVD; No carotid bruits LYMPHATICS: No lymphadenopathy CARDIAC: Hear sounds regular, 2/6 systolic murmur at the apex. RESPIRATORY:  Clear to auscultation without rales, wheezing or rhonchi  ABDOMEN: Soft, non-tender, non-distended MUSCULOSKELETAL:  No edema; No deformity  SKIN: Warm and dry NEUROLOGIC:  Alert and oriented x 3 PSYCHIATRIC:  Normal affect   Signed, Jenean Lindau, MD  10/12/2019 3:54 PM    [facility]

## 2019-10-12 NOTE — Patient Instructions (Signed)
Medication Instructions:  Your physician has recommended you make the following change in your medication:  STOP taking amlodipine  START taking TOPROL XL 50 mg (1 tablet) once daily  START taking Eliquis 5 mg (1 tablet) twice daily  *If you need a refill on your cardiac medications before your next appointment, please call your pharmacy*  Lab Work: Your physician recommends that you have a TSH drawn today  If you have labs (blood work) drawn today and your tests are completely normal, you will receive your results only by: Marland Kitchen MyChart Message (if you have MyChart) OR . A paper copy in the mail If you have any lab test that is abnormal or we need to change your treatment, we will call you to review the results.  Testing/Procedures: You had an EKG performed today  Follow-Up: At [facility], you and your health needs are our priority.  As part of our continuing mission to provide you with exceptional heart care, we have created designated Provider Care Teams.  These Care Teams include your primary Cardiologist (physician) and Advanced Practice Providers (APPs -  Physician Assistants and Nurse Practitioners) who all work together to provide you with the care you need, when you need it.  Your next appointment:   2 week(s)  The format for your next appointment:   In Person  Provider:   Jyl Heinz, MD  Other Instructions Apixaban oral tablets What is this medicine? APIXABAN (a PIX a ban) is an anticoagulant (blood thinner). It is used to lower the chance of stroke in people with a medical condition called atrial fibrillation. It is also used to treat or prevent blood clots in the lungs or in the veins. This medicine may be used for other purposes; ask your health care provider or pharmacist if you have questions. COMMON BRAND NAME(S): Eliquis What should I tell my health care provider before I take this medicine? They need to know if you have any of these  conditions:  antiphospholipid antibody syndrome  bleeding disorders  bleeding in the brain  blood in your stools (black or tarry stools) or if you have blood in your vomit  history of blood clots  history of stomach bleeding  kidney disease  liver disease  mechanical heart valve  an unusual or allergic reaction to apixaban, other medicines, foods, dyes, or preservatives  pregnant or trying to get pregnant  breast-feeding How should I use this medicine? Take this medicine by mouth with a glass of water. Follow the directions on the prescription label. You can take it with or without food. If it upsets your stomach, take it with food. Take your medicine at regular intervals. Do not take it more often than directed. Do not stop taking except on your doctor's advice. Stopping this medicine may increase your risk of a blood clot. Be sure to refill your prescription before you run out of medicine. Talk to your pediatrician regarding the use of this medicine in children. Special care may be needed. Overdosage: If you think you have taken too much of this medicine contact a poison control center or emergency room at once. NOTE: This medicine is only for you. Do not share this medicine with others. What if I miss a dose? If you miss a dose, take it as soon as you can. If it is almost time for your next dose, take only that dose. Do not take double or extra doses. What may interact with this medicine? This medicine may interact with  the following:  aspirin and aspirin-like medicines  certain medicines for fungal infections like ketoconazole and itraconazole  certain medicines for seizures like carbamazepine and phenytoin  certain medicines that treat or prevent blood clots like warfarin, enoxaparin, and dalteparin  clarithromycin  NSAIDs, medicines for pain and inflammation, like ibuprofen or naproxen  rifampin  ritonavir  St. John's wort This list may not describe all  possible interactions. Give your health care provider a list of all the medicines, herbs, non-prescription drugs, or dietary supplements you use. Also tell them if you smoke, drink alcohol, or use illegal drugs. Some items may interact with your medicine. What should I watch for while using this medicine? Visit your healthcare professional for regular checks on your progress. You may need blood work done while you are taking this medicine. Your condition will be monitored carefully while you are receiving this medicine. It is important not to miss any appointments. Avoid sports and activities that might cause injury while you are using this medicine. Severe falls or injuries can cause unseen bleeding. Be careful when using sharp tools or knives. Consider using an Copy. Take special care brushing or flossing your teeth. Report any injuries, bruising, or red spots on the skin to your healthcare professional. If you are going to need surgery or other procedure, tell your healthcare professional that you are taking this medicine. Wear a medical ID bracelet or chain. Carry a card that describes your disease and details of your medicine and dosage times. What side effects may I notice from receiving this medicine? Side effects that you should report to your doctor or health care professional as soon as possible:  allergic reactions like skin rash, itching or hives, swelling of the face, lips, or tongue  signs and symptoms of bleeding such as bloody or black, tarry stools; red or dark-brown urine; spitting up blood or brown material that looks like coffee grounds; red spots on the skin; unusual bruising or bleeding from the eye, gums, or nose  signs and symptoms of a blood clot such as chest pain; shortness of breath; pain, swelling, or warmth in the leg  signs and symptoms of a stroke such as changes in vision; confusion; trouble speaking or understanding; severe headaches; sudden numbness or  weakness of the face, arm or leg; trouble walking; dizziness; loss of coordination This list may not describe all possible side effects. Call your doctor for medical advice about side effects. You may report side effects to FDA at 1-800-FDA-1088. Where should I keep my medicine? Keep out of the reach of children. Store at room temperature between 20 and 25 degrees C (68 and 77 degrees F). Throw away any unused medicine after the expiration date. NOTE: This sheet is a summary. It may not cover all possible information. If you have questions about this medicine, talk to your doctor, pharmacist, or health care provider.  2020 Elsevier/Gold Standard (2018-05-28 17:39:34) Metoprolol Extended-Release Tablets What is this medicine? METOPROLOL (me TOE proe lole) is a beta blocker. It decreases the amount of work your heart has to do and helps your heart beat regularly. It treats high blood pressure and/or prevent chest pain (also called angina). It also treats heart failure. This medicine may be used for other purposes; ask your health care provider or pharmacist if you have questions. COMMON BRAND NAME(S): toprol, Toprol XL What should I tell my health care provider before I take this medicine? They need to know if you have any of these conditions:  diabetes  heart or vessel disease like slow heart rate, worsening heart failure, heart block, sick sinus syndrome or Raynaud's disease  kidney disease  liver disease  lung or breathing disease, like asthma or emphysema  pheochromocytoma  thyroid disease  an unusual or allergic reaction to metoprolol, other beta-blockers, medicines, foods, dyes, or preservatives  pregnant or trying to get pregnant  breast-feeding How should I use this medicine? Take this drug by mouth. Take it as directed on the prescription label at the same time every day. Take it with food. You may cut the tablet in half if it is scored (has a line in the middle of it).  This may help you swallow the tablet if the whole tablet is too big. Be sure to take both halves. Do not take just one-half of the tablet. Keep taking it unless your health care provider tells you to stop. Talk to your health care provider about the use of this drug in children. While it may be prescribed for children as young as 6 for selected conditions, precautions do apply. Overdosage: If you think you have taken too much of this medicine contact a poison control center or emergency room at once. NOTE: This medicine is only for you. Do not share this medicine with others. What if I miss a dose? If you miss a dose, take it as soon as you can. If it is almost time for your next dose, take only that dose. Do not take double or extra doses. What may interact with this medicine? This medicine may interact with the following medications:  certain medicines for blood pressure, heart disease, irregular heart beat  certain medicines for depression, like monoamine oxidase (MAO) inhibitors, fluoxetine, or paroxetine  clonidine  dobutamine  epinephrine  isoproterenol  reserpine This list may not describe all possible interactions. Give your health care provider a list of all the medicines, herbs, non-prescription drugs, or dietary supplements you use. Also tell them if you smoke, drink alcohol, or use illegal drugs. Some items may interact with your medicine. What should I watch for while using this medicine? Visit your doctor or health care professional for regular check ups. Contact your doctor right away if your symptoms worsen. Check your blood pressure and pulse rate regularly. Ask your health care professional what your blood pressure and pulse rate should be, and when you should contact them. You may get drowsy or dizzy. Do not drive, use machinery, or do anything that needs mental alertness until you know how this medicine affects you. Do not sit or stand up quickly, especially if you are an  older patient. This reduces the risk of dizzy or fainting spells. Contact your doctor if these symptoms continue. Alcohol may interfere with the effect of this medicine. Avoid alcoholic drinks. This medicine may increase blood sugar. Ask your healthcare provider if changes in diet or medicines are needed if you have diabetes. What side effects may I notice from receiving this medicine? Side effects that you should report to your doctor or health care professional as soon as possible:  allergic reactions like skin rash, itching or hives  cold or numb hands or feet  depression  difficulty breathing  faint  fever with sore throat  irregular heartbeat, chest pain  rapid weight gain   signs and symptoms of high blood sugar such as being more thirsty or hungry or having to urinate more than normal. You may also feel very tired or have blurry vision.  swollen  legs or ankles Side effects that usually do not require medical attention (report to your doctor or health care professional if they continue or are bothersome):  anxiety or nervousness  change in sex drive or performance  dry skin  headache  nightmares or trouble sleeping  short term memory loss  stomach upset or diarrhea This list may not describe all possible side effects. Call your doctor for medical advice about side effects. You may report side effects to FDA at 1-800-FDA-1088. Where should I keep my medicine? Keep out of the reach of children and pets. Store at room temperature between 20 and 25 degrees C (68 and 77 degrees F). Throw away any unused drug after the expiration date. NOTE: This sheet is a summary. It may not cover all possible information. If you have questions about this medicine, talk to your doctor, pharmacist, or health care provider.  2020 Elsevier/Gold Standard (2019-04-30 18:23:00)

## 2019-10-13 ENCOUNTER — Telehealth: Payer: Self-pay | Admitting: Cardiology

## 2019-10-13 LAB — TSH: TSH: 4.1 u[IU]/mL (ref 0.450–4.500)

## 2019-10-13 MED ORDER — METOPROLOL SUCCINATE ER 50 MG PO TB24: 50.0000 mg | ORAL_TABLET | Freq: Every day | ORAL | 0 refills | Status: AC

## 2019-10-13 NOTE — Telephone Encounter (Signed)
States his metoprolol was supposed to be called to [place] [facility]

## 2019-10-13 NOTE — Addendum Note (Signed)
Addended by: Beckey Rutter on: 10/13/2019 09:33 AM   Modules accepted: Orders

## 2019-10-13 NOTE — Telephone Encounter (Signed)
Patient needs to have a #30 day supply called to the [facility] on [address] for his Metoprolol .. he cant wait til Mail order supply comes.. Please call in asap he is very upset and states that we were awareof this yesterday.

## 2019-10-14 ENCOUNTER — Telehealth: Payer: Self-pay | Admitting: *Deleted

## 2019-10-14 NOTE — Telephone Encounter (Signed)
-----   Message from Jenean Lindau, MD sent at 10/13/2019  9:19 AM EST ----- The results of the study is unremarkable. Please inform patient. I will discuss in detail at next appointment. Cc  primary care/referring physician Jenean Lindau, MD 10/13/2019 9:19 AM

## 2019-10-14 NOTE — Telephone Encounter (Signed)
Telephone call to patient. Left message that labs were normal and to call with any questions. 

## 2019-10-28 ENCOUNTER — Ambulatory Visit: Payer: Medicare Other | Admitting: Cardiology

## 2019-10-30 DIAGNOSIS — J9620 Acute and chronic respiratory failure, unspecified whether with hypoxia or hypercapnia: Secondary | ICD-10-CM | POA: Diagnosis not present

## 2019-10-30 DIAGNOSIS — I2699 Other pulmonary embolism without acute cor pulmonale: Secondary | ICD-10-CM | POA: Diagnosis not present

## 2019-10-30 DIAGNOSIS — R109 Unspecified abdominal pain: Secondary | ICD-10-CM | POA: Diagnosis not present

## 2019-10-30 DIAGNOSIS — K219 Gastro-esophageal reflux disease without esophagitis: Secondary | ICD-10-CM

## 2019-10-30 DIAGNOSIS — J90 Pleural effusion, not elsewhere classified: Secondary | ICD-10-CM | POA: Diagnosis not present

## 2019-10-30 DIAGNOSIS — D735 Infarction of spleen: Secondary | ICD-10-CM

## 2019-10-31 DIAGNOSIS — R109 Unspecified abdominal pain: Secondary | ICD-10-CM | POA: Diagnosis not present

## 2019-10-31 DIAGNOSIS — D735 Infarction of spleen: Secondary | ICD-10-CM

## 2019-10-31 DIAGNOSIS — J9620 Acute and chronic respiratory failure, unspecified whether with hypoxia or hypercapnia: Secondary | ICD-10-CM | POA: Diagnosis not present

## 2019-10-31 DIAGNOSIS — J8410 Pulmonary fibrosis, unspecified: Secondary | ICD-10-CM | POA: Diagnosis not present

## 2019-10-31 DIAGNOSIS — J90 Pleural effusion, not elsewhere classified: Secondary | ICD-10-CM

## 2019-10-31 DIAGNOSIS — I513 Intracardiac thrombosis, not elsewhere classified: Secondary | ICD-10-CM

## 2019-10-31 DIAGNOSIS — I2699 Other pulmonary embolism without acute cor pulmonale: Secondary | ICD-10-CM | POA: Diagnosis not present

## 2019-10-31 DIAGNOSIS — I429 Cardiomyopathy, unspecified: Secondary | ICD-10-CM

## 2019-10-31 DIAGNOSIS — I10 Essential (primary) hypertension: Secondary | ICD-10-CM

## 2019-10-31 DIAGNOSIS — I4892 Unspecified atrial flutter: Secondary | ICD-10-CM

## 2019-10-31 DIAGNOSIS — I361 Nonrheumatic tricuspid (valve) insufficiency: Secondary | ICD-10-CM | POA: Diagnosis not present

## 2019-10-31 DIAGNOSIS — I340 Nonrheumatic mitral (valve) insufficiency: Secondary | ICD-10-CM

## 2019-11-01 DIAGNOSIS — J9620 Acute and chronic respiratory failure, unspecified whether with hypoxia or hypercapnia: Secondary | ICD-10-CM | POA: Diagnosis not present

## 2019-11-01 DIAGNOSIS — R109 Unspecified abdominal pain: Secondary | ICD-10-CM | POA: Diagnosis not present

## 2019-11-01 DIAGNOSIS — I2699 Other pulmonary embolism without acute cor pulmonale: Secondary | ICD-10-CM | POA: Diagnosis not present

## 2019-11-01 DIAGNOSIS — I4892 Unspecified atrial flutter: Secondary | ICD-10-CM | POA: Diagnosis not present

## 2019-11-01 DIAGNOSIS — J8410 Pulmonary fibrosis, unspecified: Secondary | ICD-10-CM | POA: Diagnosis not present

## 2019-11-01 DIAGNOSIS — I513 Intracardiac thrombosis, not elsewhere classified: Secondary | ICD-10-CM | POA: Diagnosis not present

## 2019-11-01 DIAGNOSIS — J90 Pleural effusion, not elsewhere classified: Secondary | ICD-10-CM | POA: Diagnosis not present

## 2019-11-02 DIAGNOSIS — R109 Unspecified abdominal pain: Secondary | ICD-10-CM | POA: Diagnosis not present

## 2019-11-02 DIAGNOSIS — I2699 Other pulmonary embolism without acute cor pulmonale: Secondary | ICD-10-CM | POA: Diagnosis not present

## 2019-11-02 DIAGNOSIS — J9620 Acute and chronic respiratory failure, unspecified whether with hypoxia or hypercapnia: Secondary | ICD-10-CM | POA: Diagnosis not present

## 2019-11-02 DIAGNOSIS — J8410 Pulmonary fibrosis, unspecified: Secondary | ICD-10-CM | POA: Diagnosis not present

## 2019-11-02 DIAGNOSIS — I513 Intracardiac thrombosis, not elsewhere classified: Secondary | ICD-10-CM | POA: Diagnosis not present

## 2019-11-02 DIAGNOSIS — J90 Pleural effusion, not elsewhere classified: Secondary | ICD-10-CM | POA: Diagnosis not present

## 2019-11-02 DIAGNOSIS — I4892 Unspecified atrial flutter: Secondary | ICD-10-CM | POA: Diagnosis not present

## 2019-11-03 DIAGNOSIS — J8410 Pulmonary fibrosis, unspecified: Secondary | ICD-10-CM | POA: Diagnosis not present

## 2019-11-03 DIAGNOSIS — J9620 Acute and chronic respiratory failure, unspecified whether with hypoxia or hypercapnia: Secondary | ICD-10-CM | POA: Diagnosis not present

## 2019-11-03 DIAGNOSIS — R109 Unspecified abdominal pain: Secondary | ICD-10-CM | POA: Diagnosis not present

## 2019-11-03 DIAGNOSIS — I513 Intracardiac thrombosis, not elsewhere classified: Secondary | ICD-10-CM | POA: Diagnosis not present

## 2019-11-03 DIAGNOSIS — J90 Pleural effusion, not elsewhere classified: Secondary | ICD-10-CM | POA: Diagnosis not present

## 2019-11-03 DIAGNOSIS — I2699 Other pulmonary embolism without acute cor pulmonale: Secondary | ICD-10-CM | POA: Diagnosis not present

## 2019-11-03 DIAGNOSIS — I4892 Unspecified atrial flutter: Secondary | ICD-10-CM | POA: Diagnosis not present

## 2019-11-04 DIAGNOSIS — J90 Pleural effusion, not elsewhere classified: Secondary | ICD-10-CM | POA: Diagnosis not present

## 2019-11-04 DIAGNOSIS — R109 Unspecified abdominal pain: Secondary | ICD-10-CM | POA: Diagnosis not present

## 2019-11-04 DIAGNOSIS — I513 Intracardiac thrombosis, not elsewhere classified: Secondary | ICD-10-CM | POA: Diagnosis not present

## 2019-11-04 DIAGNOSIS — J9620 Acute and chronic respiratory failure, unspecified whether with hypoxia or hypercapnia: Secondary | ICD-10-CM | POA: Diagnosis not present

## 2019-11-04 DIAGNOSIS — I4892 Unspecified atrial flutter: Secondary | ICD-10-CM | POA: Diagnosis not present

## 2019-11-04 DIAGNOSIS — I2699 Other pulmonary embolism without acute cor pulmonale: Secondary | ICD-10-CM | POA: Diagnosis not present

## 2019-11-04 DIAGNOSIS — J8410 Pulmonary fibrosis, unspecified: Secondary | ICD-10-CM | POA: Diagnosis not present

## 2019-11-05 DIAGNOSIS — J90 Pleural effusion, not elsewhere classified: Secondary | ICD-10-CM | POA: Diagnosis not present

## 2019-11-05 DIAGNOSIS — J8410 Pulmonary fibrosis, unspecified: Secondary | ICD-10-CM | POA: Diagnosis not present

## 2019-11-05 DIAGNOSIS — I513 Intracardiac thrombosis, not elsewhere classified: Secondary | ICD-10-CM | POA: Diagnosis not present

## 2019-11-05 DIAGNOSIS — I4892 Unspecified atrial flutter: Secondary | ICD-10-CM | POA: Diagnosis not present

## 2019-11-05 DIAGNOSIS — R109 Unspecified abdominal pain: Secondary | ICD-10-CM | POA: Diagnosis not present

## 2019-11-05 DIAGNOSIS — I2699 Other pulmonary embolism without acute cor pulmonale: Secondary | ICD-10-CM | POA: Diagnosis not present

## 2019-11-05 DIAGNOSIS — J9620 Acute and chronic respiratory failure, unspecified whether with hypoxia or hypercapnia: Secondary | ICD-10-CM | POA: Diagnosis not present

## 2019-11-06 ENCOUNTER — Telehealth: Payer: Self-pay

## 2019-11-06 ENCOUNTER — Telehealth: Payer: Self-pay | Admitting: Cardiology

## 2019-11-06 NOTE — Telephone Encounter (Signed)
I would like to know his vital signs before we can resume this.  Also this is a question for his pulmonologist and primary care doctor also.

## 2019-11-06 NOTE — Telephone Encounter (Signed)
Called and spoke with Baird Lyons, RN with [facility] home health.  Pt was dc'd from [facility] 11/05/2019 with a PE.  Pt was seen in the hospital by Dr. Raliegh Ip.  BP 110/68 HR 87 and O2 sats 92-93% on home O2.  Pt needing skilled home nursing and occupational therapy.

## 2019-11-06 NOTE — Telephone Encounter (Signed)
Please advise if okay for home health.  Thank you!

## 2019-11-06 NOTE — Telephone Encounter (Signed)
Calvin Pugh with [facility] calling and states that she is requesting admission for home health. Patient was recently discharged from hospital. CB#: [phone] (can leave a voicemail)

## 2019-11-06 NOTE — Telephone Encounter (Signed)
Ok for verbal if that is ok 

## 2019-11-06 NOTE — Telephone Encounter (Signed)
He can resume it.  Let him talk to primary care and also pulmonologist before he does this.

## 2019-11-06 NOTE — Telephone Encounter (Signed)
Baird Lyons, with [facility], is calling requesting to have orders in sent for occupational and physical therapy for the patient. Please call to discuss at [phone].

## 2019-11-09 ENCOUNTER — Telehealth: Payer: Self-pay | Admitting: Internal Medicine

## 2019-11-09 NOTE — Telephone Encounter (Signed)
Notified Lori w/MD response, but she stated she received verbal from pt cardiac MD../l,mb

## 2019-11-09 NOTE — Telephone Encounter (Signed)
Called and spoke with Carson Myrtle at [facility] and instructed her that Dr. Geraldo Pitter as okay with home health but that the pts primary and pulmonologist need to also be included.

## 2019-11-09 NOTE — Telephone Encounter (Signed)
   Manus Gunning from [facility] calling to inform Dr Jenny Reichmann patient has been referred to hospice by home health.     Phone [place] @ [phone]

## 2019-11-24 ENCOUNTER — Ambulatory Visit: Payer: Medicare Other | Admitting: Cardiology

## 2019-12-31 DEATH — deceased

## 2020-01-12 ENCOUNTER — Ambulatory Visit: Payer: Medicare Other | Admitting: Internal Medicine
